# Patient Record
Sex: Male | Born: 1975 | Race: White | Hispanic: No | Marital: Married | Smoking: Never smoker
Health system: Southern US, Community
[De-identification: ages and names within clinical notes are randomized; demographics above are authoritative.]

## PROBLEM LIST (undated history)

## (undated) DIAGNOSIS — I10 Essential (primary) hypertension: Secondary | ICD-10-CM

## (undated) DIAGNOSIS — R519 Headache, unspecified: Secondary | ICD-10-CM

## (undated) DIAGNOSIS — K219 Gastro-esophageal reflux disease without esophagitis: Secondary | ICD-10-CM

## (undated) DIAGNOSIS — R51 Headache: Secondary | ICD-10-CM

## (undated) HISTORY — PX: VASECTOMY: SHX75

## (undated) HISTORY — PX: NO PAST SURGERIES: SHX2092

---

## 1997-11-18 ENCOUNTER — Emergency Department (HOSPITAL_COMMUNITY): Admission: EM | Admit: 1997-11-18 | Discharge: 1997-11-18 | Payer: Self-pay | Admitting: Internal Medicine

## 2004-04-08 ENCOUNTER — Emergency Department: Payer: Self-pay | Admitting: Emergency Medicine

## 2006-10-26 ENCOUNTER — Inpatient Hospital Stay: Payer: Self-pay | Admitting: General Surgery

## 2008-06-20 ENCOUNTER — Emergency Department: Payer: Self-pay | Admitting: Emergency Medicine

## 2009-09-19 ENCOUNTER — Ambulatory Visit: Payer: Self-pay | Admitting: Urology

## 2009-10-16 ENCOUNTER — Ambulatory Visit: Payer: Self-pay | Admitting: Urology

## 2011-10-26 ENCOUNTER — Ambulatory Visit: Payer: Self-pay | Admitting: Internal Medicine

## 2012-11-23 ENCOUNTER — Ambulatory Visit: Payer: Self-pay | Admitting: Family Medicine

## 2015-11-10 ENCOUNTER — Other Ambulatory Visit: Payer: Self-pay | Admitting: Orthopedic Surgery

## 2015-11-10 DIAGNOSIS — M25562 Pain in left knee: Secondary | ICD-10-CM

## 2015-11-11 ENCOUNTER — Ambulatory Visit
Admission: RE | Admit: 2015-11-11 | Discharge: 2015-11-11 | Disposition: A | Discharge status: Home / Self Care | Payer: No Typology Code available for payment source | Source: Ambulatory Visit | Attending: Orthopedic Surgery | Admitting: Orthopedic Surgery

## 2015-11-11 DIAGNOSIS — M659 Synovitis and tenosynovitis, unspecified: Secondary | ICD-10-CM | POA: Diagnosis not present

## 2015-11-11 DIAGNOSIS — M25562 Pain in left knee: Secondary | ICD-10-CM

## 2015-11-11 DIAGNOSIS — M25461 Effusion, right knee: Secondary | ICD-10-CM | POA: Diagnosis not present

## 2015-11-11 DIAGNOSIS — I8392 Asymptomatic varicose veins of left lower extremity: Secondary | ICD-10-CM | POA: Insufficient documentation

## 2015-11-11 DIAGNOSIS — M23222 Derangement of posterior horn of medial meniscus due to old tear or injury, left knee: Secondary | ICD-10-CM | POA: Diagnosis not present

## 2015-11-26 ENCOUNTER — Encounter
Admission: RE | Admit: 2015-11-26 | Discharge: 2015-11-26 | Disposition: A | Discharge status: Home / Self Care | Payer: No Typology Code available for payment source | Source: Ambulatory Visit | Attending: Orthopedic Surgery | Admitting: Orthopedic Surgery

## 2015-11-26 DIAGNOSIS — M6752 Plica syndrome, left knee: Secondary | ICD-10-CM | POA: Diagnosis not present

## 2015-11-26 DIAGNOSIS — K219 Gastro-esophageal reflux disease without esophagitis: Secondary | ICD-10-CM | POA: Diagnosis not present

## 2015-11-26 DIAGNOSIS — M25462 Effusion, left knee: Secondary | ICD-10-CM | POA: Diagnosis not present

## 2015-11-26 DIAGNOSIS — M23222 Derangement of posterior horn of medial meniscus due to old tear or injury, left knee: Secondary | ICD-10-CM | POA: Diagnosis present

## 2015-11-26 DIAGNOSIS — I10 Essential (primary) hypertension: Secondary | ICD-10-CM | POA: Diagnosis not present

## 2015-11-26 HISTORY — DX: Headache: R51

## 2015-11-26 HISTORY — DX: Essential (primary) hypertension: I10

## 2015-11-26 HISTORY — DX: Gastro-esophageal reflux disease without esophagitis: K21.9

## 2015-11-26 HISTORY — DX: Headache, unspecified: R51.9

## 2015-11-26 LAB — BASIC METABOLIC PANEL
ANION GAP: 9 (ref 5–15)
BUN: 18 mg/dL (ref 6–20)
CHLORIDE: 100 mmol/L — AB (ref 101–111)
CO2: 25 mmol/L (ref 22–32)
Calcium: 9.3 mg/dL (ref 8.9–10.3)
Creatinine, Ser: 0.69 mg/dL (ref 0.61–1.24)
GFR calc non Af Amer: >60 mL/min (ref >60)
Glucose, Bld: 228 mg/dL — ABNORMAL HIGH (ref 65–99)
POTASSIUM: 3.6 mmol/L (ref 3.5–5.1)
SODIUM: 134 mmol/L — AB (ref 135–145)

## 2015-11-26 NOTE — Patient Instructions (Signed)
  Your procedure is scheduled on: 11-27-15 [facility](THURSDAY) Report to Same Day Surgery 2nd floor medical mall To find out your arrival time please call [phone] between 1PM - 3PM on 11-26-15 [facility](WEDNESDAY)  Remember: Instructions that are not followed completely may result in serious medical risk, up to and including death, or upon the discretion of your surgeon and anesthesiologist your surgery may need to be rescheduled.    _x___ 1. Do not eat food or drink liquids after midnight. No gum chewing or hard candies.     __x__ 2. No Alcohol for 24 hours before or after surgery.   __x__3. No Smoking for 24 prior to surgery.   ____  4. Bring all medications with you on the day of surgery if instructed.    __x__ 5. Notify your doctor if there is any change in your medical condition     (cold, fever, infections).     Do not wear jewelry, make-up, hairpins, clips or nail polish.  Do not wear lotions, powders, or perfumes. You may wear deodorant.  Do not shave 48 hours prior to surgery. Men may shave face and neck.  Do not bring valuables to the hospital.    [facility] is not responsible for any belongings or valuables.               Contacts, dentures or bridgework may not be worn into surgery.  Leave your suitcase in the car. After surgery it may be brought to your room.  For patients admitted to the hospital, discharge time is determined by your treatment team.   Patients discharged the day of surgery will not be allowed to drive home.    Please read over the following fact sheets that you were given:   [facility] Preparing for Surgery and or MRSA Information   ____ Take these medicines the morning of surgery with A SIP OF WATER:    1. NONE  2.  3.  4.  5.  6.  ____Fleets enema or Magnesium Citrate as directed.   _x___ Use CHG Soap or sage wipes as directed on instruction sheet   ____ Use inhalers on the day of surgery and bring to hospital day of surgery  ____ Stop metformin  2 days prior to surgery    ____ Take 1/2 of usual insulin dose the night before surgery and none on the morning of  surgery.   ____ Stop aspirin or coumadin, or plavix  x__ Stop Anti-inflammatories such as Advil, Aleve, Ibuprofen, Motrin, Naproxen,          Naprosyn, Goodies powders or aspirin products. Ok to CONTINUE HYDROCODONE IF NEEDED   ____ Stop supplements until after surgery.    ____ Bring C-Pap to the hospital.

## 2015-11-27 ENCOUNTER — Encounter: Payer: Self-pay | Admitting: *Deleted

## 2015-11-27 ENCOUNTER — Encounter
Admission: RE | Disposition: A | Discharge status: Home / Self Care | Payer: Self-pay | Source: Ambulatory Visit | Attending: Orthopedic Surgery

## 2015-11-27 ENCOUNTER — Ambulatory Visit: Payer: No Typology Code available for payment source | Admitting: Certified Registered"

## 2015-11-27 ENCOUNTER — Ambulatory Visit
Admission: RE | Admit: 2015-11-27 | Discharge: 2015-11-27 | Disposition: A | Discharge status: Home / Self Care | Payer: No Typology Code available for payment source | Source: Ambulatory Visit | Attending: Orthopedic Surgery | Admitting: Orthopedic Surgery

## 2015-11-27 DIAGNOSIS — M25462 Effusion, left knee: Secondary | ICD-10-CM | POA: Insufficient documentation

## 2015-11-27 DIAGNOSIS — M23222 Derangement of posterior horn of medial meniscus due to old tear or injury, left knee: Secondary | ICD-10-CM | POA: Insufficient documentation

## 2015-11-27 DIAGNOSIS — K219 Gastro-esophageal reflux disease without esophagitis: Secondary | ICD-10-CM | POA: Insufficient documentation

## 2015-11-27 DIAGNOSIS — M6752 Plica syndrome, left knee: Secondary | ICD-10-CM | POA: Insufficient documentation

## 2015-11-27 DIAGNOSIS — I10 Essential (primary) hypertension: Secondary | ICD-10-CM | POA: Insufficient documentation

## 2015-11-27 HISTORY — PX: KNEE ARTHROSCOPY: SHX127

## 2015-11-27 LAB — GLUCOSE, CAPILLARY: GLUCOSE-CAPILLARY: 225 mg/dL — AB (ref 65–99)

## 2015-11-27 SURGERY — ARTHROSCOPY, KNEE
Anesthesia: Choice | Laterality: Left

## 2015-11-27 MED ORDER — LABETALOL HCL 5 MG/ML IV SOLN
INTRAVENOUS | Status: AC
  Administered 2015-11-27: 5 mg via INTRAVENOUS
  Filled 2015-11-27: qty 4

## 2015-11-27 MED ORDER — FAMOTIDINE 20 MG PO TABS
20.0000 mg | ORAL_TABLET | Freq: Once | ORAL | Status: AC
  Administered 2015-11-27: 20 mg via ORAL

## 2015-11-27 MED ORDER — BUPIVACAINE-EPINEPHRINE (PF) 0.5% -1:200000 IJ SOLN
INTRAMUSCULAR | Status: DC | PRN
  Administered 2015-11-27: 30 mL

## 2015-11-27 MED ORDER — DIPHENHYDRAMINE HCL 50 MG/ML IJ SOLN
INTRAMUSCULAR | Status: DC | PRN
  Administered 2015-11-27: 12.5 mg via INTRAVENOUS

## 2015-11-27 MED ORDER — ONDANSETRON HCL 4 MG/2ML IJ SOLN
INTRAMUSCULAR | Status: DC | PRN
  Administered 2015-11-27: 4 mg via INTRAVENOUS

## 2015-11-27 MED ORDER — LABETALOL HCL 5 MG/ML IV SOLN
INTRAVENOUS | Status: DC | PRN
  Administered 2015-11-27: 10 mg via INTRAVENOUS

## 2015-11-27 MED ORDER — LIDOCAINE HCL (CARDIAC) 20 MG/ML IV SOLN
INTRAVENOUS | Status: DC | PRN
  Administered 2015-11-27: 100 mg via INTRAVENOUS

## 2015-11-27 MED ORDER — ACETAMINOPHEN 10 MG/ML IV SOLN
INTRAVENOUS | Status: AC
  Filled 2015-11-27: qty 100

## 2015-11-27 MED ORDER — MIDAZOLAM HCL 2 MG/2ML IJ SOLN
INTRAMUSCULAR | Status: DC | PRN
Start: 2015-11-27 — End: 2015-11-27
  Administered 2015-11-27: 2 mg via INTRAVENOUS

## 2015-11-27 MED ORDER — BUPIVACAINE-EPINEPHRINE (PF) 0.5% -1:200000 IJ SOLN
INTRAMUSCULAR | Status: AC
  Filled 2015-11-27: qty 30

## 2015-11-27 MED ORDER — HYDROCODONE-ACETAMINOPHEN 5-325 MG PO TABS
ORAL_TABLET | ORAL | Status: AC
  Administered 2015-11-27: 1
  Filled 2015-11-27: qty 1

## 2015-11-27 MED ORDER — FENTANYL CITRATE (PF) 100 MCG/2ML IJ SOLN
INTRAMUSCULAR | Status: DC | PRN
  Administered 2015-11-27 (×3): 25 ug via INTRAVENOUS
  Administered 2015-11-27: 50 ug via INTRAVENOUS
  Administered 2015-11-27: 25 ug via INTRAVENOUS
  Administered 2015-11-27 (×2): 50 ug via INTRAVENOUS

## 2015-11-27 MED ORDER — FENTANYL CITRATE (PF) 100 MCG/2ML IJ SOLN
25.0000 ug | INTRAMUSCULAR | Status: AC | PRN
  Administered 2015-11-27 (×6): 25 ug via INTRAVENOUS

## 2015-11-27 MED ORDER — ONDANSETRON HCL 4 MG/2ML IJ SOLN: 4.0000 mg | Freq: Once | INTRAMUSCULAR | Status: DC | PRN

## 2015-11-27 MED ORDER — LABETALOL HCL 5 MG/ML IV SOLN
5.0000 mg | INTRAVENOUS | Status: DC | PRN
  Administered 2015-11-27 (×2): 5 mg via INTRAVENOUS

## 2015-11-27 MED ORDER — PROPOFOL 10 MG/ML IV BOLUS
INTRAVENOUS | Status: DC | PRN
  Administered 2015-11-27: 200 mg via INTRAVENOUS

## 2015-11-27 MED ORDER — LACTATED RINGERS IV SOLN: INTRAVENOUS | Status: DC

## 2015-11-27 MED ORDER — HYDROCODONE-ACETAMINOPHEN 5-325 MG PO TABS: 1.0000 | ORAL_TABLET | Freq: Four times a day (QID) | ORAL | 0 refills | Status: DC | PRN

## 2015-11-27 MED ORDER — HYDROCODONE-ACETAMINOPHEN 5-325 MG PO TABS
ORAL_TABLET | ORAL | Status: AC
  Filled 2015-11-27: qty 1

## 2015-11-27 MED ORDER — FENTANYL CITRATE (PF) 100 MCG/2ML IJ SOLN
INTRAMUSCULAR | Status: AC
  Administered 2015-11-27: 25 ug via INTRAVENOUS
  Filled 2015-11-27: qty 2

## 2015-11-27 MED ORDER — HYDROCODONE-ACETAMINOPHEN 5-325 MG PO TABS
1.0000 | ORAL_TABLET | Freq: Four times a day (QID) | ORAL | Status: AC | PRN
Start: 2015-11-27 — End: 2015-11-27
  Administered 2015-11-27 (×2): 1 via ORAL

## 2015-11-27 MED ORDER — FAMOTIDINE 20 MG PO TABS
ORAL_TABLET | ORAL | Status: AC
  Filled 2015-11-27: qty 1

## 2015-11-27 MED ORDER — SODIUM CHLORIDE 0.9 % IV SOLN
INTRAVENOUS | Status: DC
  Administered 2015-11-27: 10:00:00 via INTRAVENOUS

## 2015-11-27 SURGICAL SUPPLY — 29 items
BANDAGE ACE 4X5 VEL STRL LF (GAUZE/BANDAGES/DRESSINGS) IMPLANT
BANDAGE ELASTIC 4 LF NS (GAUZE/BANDAGES/DRESSINGS) ×3 IMPLANT
BLADE FULL RADIUS 3.5 (BLADE) IMPLANT
BLADE INCISOR PLUS 4.5 (BLADE) IMPLANT
BLADE SHAVER 4.5 DBL SERAT CV (CUTTER) IMPLANT
BLADE SHAVER 4.5X7 STR FR (MISCELLANEOUS) IMPLANT
BNDG CMPR MED 5X4 ELC HKLP NS (GAUZE/BANDAGES/DRESSINGS) ×1
CHLORAPREP W/TINT 26ML (MISCELLANEOUS) ×3 IMPLANT
CUFF TOURN 24 STER (MISCELLANEOUS) IMPLANT
CUFF TOURN 30 STER DUAL PORT (MISCELLANEOUS) ×2 IMPLANT
GAUZE SPONGE 4X4 12PLY STRL (GAUZE/BANDAGES/DRESSINGS) ×3 IMPLANT
GLOVE SURG SYN 9.0  PF PI (GLOVE) ×2
GLOVE SURG SYN 9.0 PF PI (GLOVE) ×1 IMPLANT
GOWN SRG 2XL LVL 4 RGLN SLV (GOWNS) ×1 IMPLANT
GOWN STRL NON-REIN 2XL LVL4 (GOWNS) ×3
GOWN STRL REUS W/ TWL LRG LVL3 (GOWN DISPOSABLE) ×2 IMPLANT
GOWN STRL REUS W/TWL LRG LVL3 (GOWN DISPOSABLE) ×6
IV LACTATED RINGER IRRG 3000ML (IV SOLUTION) ×9
IV LR IRRIG 3000ML ARTHROMATIC (IV SOLUTION) ×2 IMPLANT
KIT RM TURNOVER STRD PROC AR (KITS) ×3 IMPLANT
MANIFOLD NEPTUNE II (INSTRUMENTS) ×3 IMPLANT
PACK ARTHROSCOPY KNEE (MISCELLANEOUS) ×3 IMPLANT
SET TUBE SUCT SHAVER OUTFL 24K (TUBING) ×3 IMPLANT
SET TUBE TIP INTRA-ARTICULAR (MISCELLANEOUS) ×3 IMPLANT
SUT ETHILON 4-0 (SUTURE) ×3
SUT ETHILON 4-0 FS2 18XMFL BLK (SUTURE) ×1
SUTURE ETHLN 4-0 FS2 18XMF BLK (SUTURE) ×1 IMPLANT
TUBING ARTHRO INFLOW-ONLY STRL (TUBING) ×3 IMPLANT
WAND HAND CNTRL MULTIVAC 50 (MISCELLANEOUS) ×3 IMPLANT

## 2015-11-27 NOTE — Progress Notes (Signed)
Pt having quite a bit of discomfort   Dr. Rosita KeaMenz called and pain pill repeated per doctor

## 2015-11-27 NOTE — Transfer of Care (Signed)
Immediate Anesthesia Transfer of Care Note  Patient: Brent LootsBrian E Hershberger  Procedure(s) Performed: Procedure(s): left knee arthroscopy, medial menisectomy, excision of plica (Left)  Patient Location: PACU  Anesthesia Type:General  Level of Consciousness: awake  Airway & Oxygen Therapy: Patient Spontanous Breathing and Patient connected to face mask oxygen  Post-op Assessment: Report given to RN and Post -op Vital signs reviewed and stable  Post vital signs: Reviewed and stable  Last Vitals:  Vitals:   11/27/15 1001  BP: 140/90  Pulse: 75  Resp: 16  Temp: 36.8 C    Last Pain:  Vitals:   11/27/15 1001  TempSrc: Oral  PainSc: 5          Complications: No apparent anesthesia complications

## 2015-11-27 NOTE — Progress Notes (Signed)
STATES PAIN IS IMPROVED AND IS READY TO GO HOME

## 2015-11-27 NOTE — Anesthesia Procedure Notes (Signed)
Procedure Name: LMA Insertion Performed by: Yosgar Demirjian Pre-anesthesia Checklist: Patient identified, Patient being monitored, Timeout performed, Emergency Drugs available and Suction available Patient Re-evaluated:Patient Re-evaluated prior to inductionOxygen Delivery Method: Circle system utilized Preoxygenation: Pre-oxygenation with 100% oxygen Intubation Type: IV induction Ventilation: Mask ventilation without difficulty LMA: LMA inserted LMA Size: 5.0 Tube type: Oral Number of attempts: 1 Placement Confirmation: positive ETCO2 and breath sounds checked- equal and bilateral Tube secured with: Tape Dental Injury: Teeth and Oropharynx as per pre-operative assessment      

## 2015-11-27 NOTE — Op Note (Signed)
11/27/2015  12:26 PM  PATIENT:  Brent Compton  40 y.o. male  PRE-OPERATIVE DIAGNOSIS:  acute medial meniscus tear  POST-OPERATIVE DIAGNOSIS:  medial meniscal tear, plica left knee  PROCEDURE:  Procedure(s): left knee arthroscopy, medial menisectomy, excision of plica (Left)  SURGEON: Leitha SchullerMichael J Perris Conwell, MD  ASSISTANTS: None  ANESTHESIA:   general  EBL:  Total I/O In: 500 [I.V.:500] Out: 20 [Blood:20]  BLOOD ADMINISTERED:none  DRAINS: none   LOCAL MEDICATIONS USED:  MARCAINE     SPECIMEN:  No Specimen  DISPOSITION OF SPECIMEN:  N/A  COUNTS:  YES  TOURNIQUET:    IMPLANTS: None  DICTATION: .Dragon Dictation patient was brought to the operating room and after adequate general anesthesia was obtained, the left leg was placed in the arthroscopic leg holder with a tourniquet applied. After prepping and draping in the usual sterile fashion, appropriate patient identification and timeout procedures were completed. An inferior lateral portal was made and the arthroscope introduced. Initial inspection revealed plica band at them medial femoral condyle which appeared to impinge at the patellofemoral joint. Gutters were free of any loose bodies. Going to the medial compartment inferior medial portal was made and on probing there is extensive tear of the posterior horn of the medial meniscus consistent with MRI. This had predominantly vertical component to it but it was very degenerated a degenerated and had no chance for repair. Meniscal Punch and ArthriCare wand were used to debride this back to a stable margin. There was some fibrillation of the medial femoral condyle and tibial condyle but no fissuring or exposed bone. Anterior cruciate ligament intact lateral compartment normal at this point the shaver was introduced and the plica band was debrided so it no longer impinged at the patellofemoral joint. Pre-and post procedure pictures obtained after addressing the meniscal pathology and  plica the knee was thoroughly irrigated and all argentation withdrawn. Wounds closed with simple 4-0 nylon skin suture and infiltrated with 20 cc half percent Sensorcaine for postop analgesia. Xeroform 4 x 4's web roll and Ace wrap applied  PLAN OF CARE: Discharge to home after PACU  PATIENT DISPOSITION:  PACU - hemodynamically stable.

## 2015-11-27 NOTE — Discharge Instructions (Signed)
Take the medicine as directed. Leave dressing on but if bandage slides down the leg remove entire bandage, covered to incisions with Band-Aids and reapply Ace wrap. Weightbearing as tolerated on leg. Aspirin 325 mg daily until walking more normally

## 2015-11-27 NOTE — H&P (Signed)
Reviewed paper H+P, will be scanned into chart. No changes noted.  

## 2015-11-27 NOTE — Anesthesia Preprocedure Evaluation (Signed)
Anesthesia Evaluation  Patient identified by MRN, date of birth, ID band Patient awake    Reviewed: Allergy & Precautions, H&P , NPO status , Patient's Chart, lab work & pertinent test results, reviewed documented beta blocker date and time   Airway Mallampati: II  TM Distance: >3 FB Neck ROM: full    Dental  (+) Teeth Intact   Pulmonary neg pulmonary ROS,    Pulmonary exam normal        Cardiovascular Exercise Tolerance: Good hypertension, negative cardio ROS Normal cardiovascular exam Rhythm:regular Rate:Normal     Neuro/Psych  Headaches, negative neurological ROS  negative psych ROS   GI/Hepatic negative GI ROS, Neg liver ROS, GERD  Medicated,  Endo/Other  negative endocrine ROS  Renal/GU negative Renal ROS  negative genitourinary   Musculoskeletal   Abdominal   Peds  Hematology negative hematology ROS (+)   Anesthesia Other Findings   Reproductive/Obstetrics negative OB ROS                             Anesthesia Physical Anesthesia Plan  ASA: III  Anesthesia Plan: General LMA   Post-op Pain Management:    Induction:   Airway Management Planned:   Additional Equipment:   Intra-op Plan:   Post-operative Plan:   Informed Consent: I have reviewed the patients History and Physical, chart, labs and discussed the procedure including the risks, benefits and alternatives for the proposed anesthesia with the patient or authorized representative who has indicated his/her understanding and acceptance.     Plan Discussed with: CRNA  Anesthesia Plan Comments:         Anesthesia Quick Evaluation

## 2015-11-28 ENCOUNTER — Encounter: Payer: Self-pay | Admitting: Orthopedic Surgery

## 2015-12-01 NOTE — Anesthesia Postprocedure Evaluation (Signed)
Anesthesia Post Note  Patient: Brent Compton  Procedure(s) Performed: Procedure(s) (LRB): left knee arthroscopy, medial menisectomy, excision of plica (Left)  Patient location during evaluation: PACU Anesthesia Type: General Level of consciousness: awake and alert Pain management: pain level controlled Vital Signs Assessment: post-procedure vital signs reviewed and stable Respiratory status: spontaneous breathing, nonlabored ventilation, respiratory function stable and patient connected to nasal cannula oxygen Cardiovascular status: blood pressure returned to baseline and stable Postop Assessment: no signs of nausea or vomiting Anesthetic complications: no    Last Vitals:  Vitals:   11/27/15 1400 11/27/15 1502  BP: (!) 150/80 (!) 165/82  Pulse: 67 72  Resp:    Temp:      Last Pain:  Vitals:   11/28/15 0804  TempSrc:   PainSc: 3                  Yevette EdwardsJames G Senetra Dillin

## 2017-11-29 ENCOUNTER — Ambulatory Visit (INDEPENDENT_AMBULATORY_CARE_PROVIDER_SITE_OTHER): Payer: 59 | Admitting: Physician Assistant

## 2017-11-29 ENCOUNTER — Encounter: Payer: Self-pay | Admitting: Physician Assistant

## 2017-11-29 VITALS — BP 130/84 | HR 76 | Temp 98.5°F | Resp 16 | Ht 71.0 in | Wt 294.8 lb

## 2017-11-29 DIAGNOSIS — E1169 Type 2 diabetes mellitus with other specified complication: Secondary | ICD-10-CM | POA: Diagnosis not present

## 2017-11-29 DIAGNOSIS — Z114 Encounter for screening for human immunodeficiency virus [HIV]: Secondary | ICD-10-CM

## 2017-11-29 DIAGNOSIS — I10 Essential (primary) hypertension: Secondary | ICD-10-CM

## 2017-11-29 DIAGNOSIS — R739 Hyperglycemia, unspecified: Secondary | ICD-10-CM

## 2017-11-29 DIAGNOSIS — E785 Hyperlipidemia, unspecified: Secondary | ICD-10-CM

## 2017-11-29 LAB — POCT GLYCOSYLATED HEMOGLOBIN (HGB A1C)
Est. average glucose Bld gHb Est-mCnc: 212
Hemoglobin A1C: 9 % — AB (ref 4.0–5.6)

## 2017-11-29 NOTE — Patient Instructions (Signed)

## 2017-11-29 NOTE — Progress Notes (Signed)
synthroi      Patient: Brent Compton, Male    DOB: 12/04/1975, 42 y.o.   MRN: [id number] Visit Date: 11/29/2017  Today's Provider: Trey Sailors, PA-C   Chief Complaint  Patient presents with  . New Patient (Initial Visit)   Subjective:    Annual physical exam Brent Compton is a 42 y.o. male who presents today for health maintenance and complete physical. He feels well. He reports exercising none. He reports he is sleeping well.  Living in [place], [place]. Works for [employer]. Lives with wife of 15 years. Three girls - ages 51, 73, and 28. No health issues. Reports having had a tetanus shot this year.   High blood pressure - takes Lisinopril HCTZ 20-12.5 mg daily. Has been taking it 3 years. Reports he is stable on this medication, denies chest pain, SOB, edema.   BP Readings from Last 3 Encounters:  11/29/17 130/84  11/27/15 (!) 165/82   Wt Readings from Last 3 Encounters:  11/29/17 294 lb 12.8 oz (133.7 kg)  11/27/15 285 lb (129.3 kg)  11/26/15 300 lb (136.1 kg)    CMET from 2017 during surgery for meniscal repair showed sugar of 225. He reports not having sugars checked since then. Reports family history of diabetes in mother. Reports frequent urination and thirst. A1c today is 9.0%. -----------------------------------------------------------------   Review of Systems  Constitutional: Negative.   HENT: Negative.   Eyes: Negative.   Respiratory: Negative.   Cardiovascular: Negative.   Gastrointestinal: Negative.   Endocrine: Negative.   Genitourinary: Negative.   Musculoskeletal: Negative.   Skin: Negative.   Allergic/Immunologic: Negative.   Neurological: Negative.   Hematological: Negative.   Psychiatric/Behavioral: Negative.     Social History      He  reports that he has never smoked. He has never used smokeless tobacco. He reports that he drinks alcohol. He reports that he does not use drugs.       Social History   Socioeconomic History  . Marital  status: Married    Spouse name: Not on file  . Number of children: Not on file  . Years of education: Not on file  . Highest education level: Not on file  Occupational History  . Not on file  Social Needs  . Financial resource strain: Not on file  . Food insecurity:    Worry: Not on file    Inability: Not on file  . Transportation needs:    Medical: Not on file    Non-medical: Not on file  Tobacco Use  . Smoking status: Never Smoker  . Smokeless tobacco: Never Used  Substance and Sexual Activity  . Alcohol use: Yes    Comment: OCC  . Drug use: No  . Sexual activity: Not on file  Lifestyle  . Physical activity:    Days per week: Not on file    Minutes per session: Not on file  . Stress: Not on file  Relationships  . Social connections:    Talks on phone: Not on file    Gets together: Not on file    Attends religious service: Not on file    Active member of club or organization: Not on file    Attends meetings of clubs or organizations: Not on file    Relationship status: Not on file  Other Topics Concern  . Not on file  Social History Narrative  . Not on file    Past Medical History:  Diagnosis Date  .  GERD (gastroesophageal reflux disease)    RARE-ROLAIDS PRN  . Headache    RARE MIGRAINES  . Hypertension      There are no active problems to display for this patient.   Past Surgical History:  Procedure Laterality Date  . KNEE ARTHROSCOPY Left 11/27/2015   Procedure: left knee arthroscopy, medial menisectomy, excision of plica;  Surgeon: Kennedy Bucker, MD;  Location: [facility] ORS;  Service: Orthopedics;  Laterality: Left;  . NO PAST SURGERIES    . VASECTOMY      Family History        Family Status  Relation Name Status  . Mother  Alive  . Father  (Not Specified)        His family history includes Diabetes in his mother; Hypertension in his father and mother.      No Known Allergies   Current Outpatient Medications:  .   lisinopril-hydrochlorothiazide (PRINZIDE,ZESTORETIC) 20-12.5 MG tablet, Take 1 tablet by mouth every morning., Disp: , Rfl:    Patient Care Team: Maryella Shivers as PCP - General (Physician Assistant)      Objective:   Vitals: BP 130/84 (BP Location: Left Arm, Patient Position: Sitting, Cuff Size: Large)   Pulse 76   Temp 98.5 F (36.9 C) (Oral)   Resp 16   Ht 5\' 11"  (1.803 m)   Wt 294 lb 12.8 oz (133.7 kg)   BMI 41.12 kg/m    Vitals:   11/29/17 1408  BP: 130/84  Pulse: 76  Resp: 16  Temp: 98.5 F (36.9 C)  TempSrc: Oral  Weight: 294 lb 12.8 oz (133.7 kg)  Height: 5\' 11"  (1.803 m)     Physical Exam  Constitutional: He is oriented to person, place, and time. He appears well-developed and well-nourished.  Cardiovascular: Normal rate and regular rhythm.  Pulmonary/Chest: Effort normal and breath sounds normal.  Abdominal: Soft. Bowel sounds are normal.  Neurological: He is alert and oriented to person, place, and time.  Skin: Skin is warm and dry.  Psychiatric: He has a normal mood and affect. His behavior is normal.     Depression Screen PHQ 2/9 Scores 11/29/2017  PHQ - 2 Score 0  PHQ- 9 Score 0      Assessment & Plan:     Routine Health Maintenance and Physical Exam  Exercise Activities and Dietary recommendations Goals   None      There is no immunization history on file for this patient.  Health Maintenance  Topic Date Due  . HIV Screening  09/10/1990  . TETANUS/TDAP  09/10/1994  . INFLUENZA VACCINE  09/01/2017     Discussed health benefits of physical activity, and encouraged him to engage in regular exercise appropriate for his age and condition.    1. Hypertension, unspecified type  Check lab work, refill BP medication   - Comprehensive Metabolic Panel (CMET) - CBC with Differential - TSH - Lipid Profile  2. Hyperglycemia  - POCT HgB A1C  3. Encounter for screening for HIV  - HIV antibody (with reflex)  4. Type 2  diabetes mellitus with other specified complication, without long-term current use of insulin (HCC)  Has diabetes, follow up in 1 week to discuss treatment.   Return in about 1 week (around 12/06/2017) for TYpe II diabetes, 40 min.  The entirety of the information documented in the History of Present Illness, Review of Systems and Physical Exam were personally obtained by me. Portions of this information were initially documented by Rondel Baton,  CMA and reviewed by me for thoroughness and accuracy.   I have spent 45 minutes with this patient, >50% of which was spent on counseling and coordination of care.   --------------------------------------------------------------------    Trey Sailors, PA-C  [facility]

## 2017-11-30 ENCOUNTER — Telehealth: Payer: Self-pay

## 2017-11-30 LAB — COMPREHENSIVE METABOLIC PANEL
ALT: 51 IU/L — ABNORMAL HIGH (ref 0–44)
AST: 25 IU/L (ref 0–40)
Albumin/Globulin Ratio: 1.8 (ref 1.2–2.2)
Albumin: 4.2 g/dL (ref 3.5–5.5)
Alkaline Phosphatase: 66 IU/L (ref 39–117)
BUN/Creatinine Ratio: 22 — ABNORMAL HIGH (ref 9–20)
BUN: 16 mg/dL (ref 6–24)
Bilirubin Total: 0.3 mg/dL (ref 0.0–1.2)
CO2: 19 mmol/L — ABNORMAL LOW (ref 20–29)
Calcium: 9 mg/dL (ref 8.7–10.2)
Chloride: 98 mmol/L (ref 96–106)
Creatinine, Ser: 0.72 mg/dL — ABNORMAL LOW (ref 0.76–1.27)
GFR calc Af Amer: 133 mL/min/{1.73_m2} (ref >59)
GFR calc non Af Amer: 115 mL/min/{1.73_m2} (ref >59)
Globulin, Total: 2.4 g/dL (ref 1.5–4.5)
Glucose: 317 mg/dL — ABNORMAL HIGH (ref 65–99)
Potassium: 4 mmol/L (ref 3.5–5.2)
Sodium: 136 mmol/L (ref 134–144)
Total Protein: 6.6 g/dL (ref 6.0–8.5)

## 2017-11-30 LAB — CBC WITH DIFFERENTIAL/PLATELET
Basophils Absolute: 0.1 10*3/uL (ref 0.0–0.2)
Basos: 1 %
EOS (ABSOLUTE): 0.3 10*3/uL (ref 0.0–0.4)
Eos: 4 %
Hematocrit: 46.2 % (ref 37.5–51.0)
Hemoglobin: 16.2 g/dL (ref 13.0–17.7)
Immature Grans (Abs): 0 10*3/uL (ref 0.0–0.1)
Immature Granulocytes: 1 %
Lymphocytes Absolute: 2.4 10*3/uL (ref 0.7–3.1)
Lymphs: 35 %
MCH: 30.7 pg (ref 26.6–33.0)
MCHC: 35.1 g/dL (ref 31.5–35.7)
MCV: 88 fL (ref 79–97)
Monocytes Absolute: 0.5 10*3/uL (ref 0.1–0.9)
Monocytes: 7 %
Neutrophils Absolute: 3.7 10*3/uL (ref 1.4–7.0)
Neutrophils: 52 %
Platelets: 208 10*3/uL (ref 150–450)
RBC: 5.28 x10E6/uL (ref 4.14–5.80)
RDW: 12.7 % (ref 12.3–15.4)
WBC: 7 10*3/uL (ref 3.4–10.8)

## 2017-11-30 LAB — TSH: TSH: 1.41 u[IU]/mL (ref 0.450–4.500)

## 2017-11-30 LAB — LIPID PANEL
Chol/HDL Ratio: 5.6 ratio — ABNORMAL HIGH (ref 0.0–5.0)
Cholesterol, Total: 179 mg/dL (ref 100–199)
HDL: 32 mg/dL — ABNORMAL LOW (ref >39)
Triglycerides: 775 mg/dL (ref 0–149)

## 2017-11-30 LAB — HIV ANTIBODY (ROUTINE TESTING W REFLEX): HIV Screen 4th Generation wRfx: NONREACTIVE

## 2017-11-30 NOTE — Telephone Encounter (Signed)
-----   Message from Trey Sailors, [place] sent at 11/30/2017  9:16 AM EDT ----- Sugar elevated, which is expected for diabetes. CBC normal. TSH normal. Cholesterol with very high triglycerides, can we add direct LDL under dx hyperlipidemia? HIV negative. We will discuss further treatment in follow up.

## 2017-11-30 NOTE — Telephone Encounter (Signed)
Called [facility]

## 2017-12-02 DIAGNOSIS — E119 Type 2 diabetes mellitus without complications: Secondary | ICD-10-CM | POA: Insufficient documentation

## 2017-12-06 ENCOUNTER — Ambulatory Visit (INDEPENDENT_AMBULATORY_CARE_PROVIDER_SITE_OTHER): Payer: 59 | Admitting: Physician Assistant

## 2017-12-06 ENCOUNTER — Encounter: Payer: Self-pay | Admitting: Physician Assistant

## 2017-12-06 VITALS — BP 116/80 | HR 97 | Temp 98.2°F | Resp 16 | Wt 288.0 lb

## 2017-12-06 DIAGNOSIS — Z23 Encounter for immunization: Secondary | ICD-10-CM | POA: Diagnosis not present

## 2017-12-06 DIAGNOSIS — E1169 Type 2 diabetes mellitus with other specified complication: Secondary | ICD-10-CM | POA: Diagnosis not present

## 2017-12-06 DIAGNOSIS — I10 Essential (primary) hypertension: Secondary | ICD-10-CM | POA: Diagnosis not present

## 2017-12-06 MED ORDER — LISINOPRIL-HYDROCHLOROTHIAZIDE 20-12.5 MG PO TABS: 1.0000 | ORAL_TABLET | ORAL | 0 refills | Status: DC

## 2017-12-06 MED ORDER — METFORMIN HCL 500 MG PO TABS: ORAL_TABLET | ORAL | 3 refills | Status: DC

## 2017-12-06 NOTE — Progress Notes (Signed)
Patient: Brent Compton Male    DOB: 05/03/1975   42 y.o.   MRN: [id number] Visit Date: 12/09/2017  Today's Provider: Trey Sailors, PA-C   Chief Complaint  Patient presents with  . Follow-up   Subjective:    HPI   Patient here to follow up on labs. Patient presented as a new patient to this clinic on 11/29/2017 and his a1c was 9.0%. Glucose on CMET from 2017 was 228, though patient reports he was never formally told he had diabetes. Currently takes Lisinopril HCTZ 20-12.5 mg. Presents with girlfriend today.   Due for eye exam, foot exam, Pneumovax.   Does eat fast food sometimes. Can drink 2-10 beers, it just depends on the day.      No Known Allergies   Current Outpatient Medications:  .  lisinopril-hydrochlorothiazide (PRINZIDE,ZESTORETIC) 20-12.5 MG tablet, Take 1 tablet by mouth every morning., Disp: 90 tablet, Rfl: 0 .  metFORMIN (GLUCOPHAGE) 500 MG tablet, 500 mg 1x daily x 1 wk. Then 500 mg 2x daily x 1 wk. Then 1000 mg in morning & 500 mg nightly x 1 wk. Then 1000 mg 2x daily onward., Disp: 180 tablet, Rfl: 3  Review of Systems  Social History   Tobacco Use  . Smoking status: Never Smoker  . Smokeless tobacco: Never Used  Substance Use Topics  . Alcohol use: Yes    Comment: OCC   Objective:   BP 116/80 (BP Location: Left Arm, Patient Position: Sitting, Cuff Size: Large)   Pulse 97   Temp 98.2 F (36.8 C) (Oral)   Resp 16   Wt 288 lb (130.6 kg)   SpO2 96%   BMI 40.17 kg/m  Vitals:   12/06/17 1607  BP: 116/80  Pulse: 97  Resp: 16  Temp: 98.2 F (36.8 C)  TempSrc: Oral  SpO2: 96%  Weight: 288 lb (130.6 kg)     Physical Exam  Constitutional: He is oriented to person, place, and time. He appears well-developed and well-nourished.  Cardiovascular: Normal rate and regular rhythm.  Pulmonary/Chest: Effort normal and breath sounds normal.  Neurological: He is alert and oriented to person, place, and time.  Skin: Skin is warm and dry.    Psychiatric: He has a normal mood and affect. His behavior is normal.   Diabetic Foot Exam - Simple   Simple Foot Form Diabetic Foot exam was performed with the following findings:  Yes 12/06/2017  4:51 PM  Visual Inspection No deformities, no ulcerations, no other skin breakdown bilaterally:  Yes Sensation Testing Intact to touch and monofilament testing bilaterally:  Yes Pulse Check Posterior Tibialis and Dorsalis pulse intact bilaterally:  Yes Comments         Assessment & Plan:     1. Type 2 diabetes mellitus with other specified complication, without long-term current use of insulin (HCC)  See in one month.   - metFORMIN (GLUCOPHAGE) 500 MG tablet; 500 mg 1x daily x 1 wk. Then 500 mg 2x daily x 1 wk. Then 1000 mg in morning & 500 mg nightly x 1 wk. Then 1000 mg 2x daily onward.  Dispense: 180 tablet; Refill: 3 - Pneumococcal polysaccharide vaccine 23-valent greater than or equal to 2yo subcutaneous/IM  2. Hypertension, unspecified type  - lisinopril-hydrochlorothiazide (PRINZIDE,ZESTORETIC) 20-12.5 MG tablet; Take 1 tablet by mouth every morning.  Dispense: 90 tablet; Refill: 0  The entirety of the information documented in the History of Present Illness, Review of Systems and Physical Exam  were personally obtained by me. Portions of this information were initially documented by Rondel Baton, CMA and reviewed by me for thoroughness and accuracy.        Trey Sailors, PA-C  [facility]

## 2017-12-07 LAB — LDL CHOLESTEROL, DIRECT: LDL Direct: 76 mg/dL (ref 0–99)

## 2017-12-07 LAB — SPECIMEN STATUS REPORT

## 2017-12-09 NOTE — Patient Instructions (Signed)

## 2018-01-05 ENCOUNTER — Encounter: Payer: Self-pay | Admitting: Physician Assistant

## 2018-01-05 ENCOUNTER — Ambulatory Visit (INDEPENDENT_AMBULATORY_CARE_PROVIDER_SITE_OTHER): Payer: 59 | Admitting: Physician Assistant

## 2018-01-05 VITALS — BP 129/82 | HR 102 | Temp 98.5°F | Resp 16 | Wt 280.6 lb

## 2018-01-05 DIAGNOSIS — E1169 Type 2 diabetes mellitus with other specified complication: Secondary | ICD-10-CM | POA: Diagnosis not present

## 2018-01-05 DIAGNOSIS — I10 Essential (primary) hypertension: Secondary | ICD-10-CM | POA: Diagnosis not present

## 2018-01-05 DIAGNOSIS — E1159 Type 2 diabetes mellitus with other circulatory complications: Secondary | ICD-10-CM

## 2018-01-05 LAB — POCT GLYCOSYLATED HEMOGLOBIN (HGB A1C)
Est. average glucose Bld gHb Est-mCnc: 192
Hemoglobin A1C: 8.3 % — AB (ref 4.0–5.6)

## 2018-01-05 MED ORDER — METFORMIN HCL 1000 MG PO TABS: 1000.0000 mg | ORAL_TABLET | Freq: Two times a day (BID) | ORAL | 0 refills | Status: DC

## 2018-01-05 NOTE — Progress Notes (Signed)
,       Patient: Brent Compton Male    DOB: August 03, 1975   42 y.o.   MRN: [id number] Visit Date: 01/06/2018  Today's Provider: Trey Sailors, PA-C   Chief Complaint  Patient presents with  . Follow-up    HTN and T2DM   Subjective:    HPI  Diabetes Mellitus Type II, Follow-up:   Lab Results  Component Value Date   HGBA1C 8.3 (A) 01/05/2018   HGBA1C 9.0 (A) 11/29/2017    Last seen for diabetes 1 months ago.  Management since then includes Metformin titrated up to 1000 mg BID over the course of four weeks. He did not start the metformin immediately after his visit.  He reports excellent compliance with treatment. He is not having side effects.  Current symptoms include none and have been stable. Home blood sugar records: fasting range: 200-150  Episodes of hypoglycemia? no   Current Insulin Regimen: none Most Recent Eye Exam: Is due. He is contacting [facility] to schedule this. Weight trend: decreasing steadily Prior visit with dietician: no Current diet: well balanced. He has stopped drinking beer as regularly.  Current exercise: physical work  Pertinent Labs:    Component Value Date/Time   CHOL 179 11/29/2017 1456   TRIG 775 (HH) 11/29/2017 1456   HDL 32 (L) 11/29/2017 1456   LDLCALC Comment 11/29/2017 1456   CREATININE 0.72 (L) 11/29/2017 1456    Wt Readings from Last 3 Encounters:  01/05/18 280 lb 9.6 oz (127.3 kg)  12/06/17 288 lb (130.6 kg)  11/29/17 294 lb 12.8 oz (133.7 kg)    ------------------------------------------------------------------------  Hypertension, follow-up:  BP Readings from Last 3 Encounters:  01/05/18 129/82  12/06/17 116/80  11/29/17 130/84    He was last seen for hypertension 1 months ago.  BP at that visit was 130/84. Management changes since that visit include none. He currently takes lisinopril-HCTZ 20-12.5mg  daily.  He reports excellent compliance with treatment. He is not having side effects.  He is not  exercising. He in between adherent to low salt diet.   Outside blood pressures are n/a. He is experiencing none.  Patient denies chest pain, chest pressure/discomfort, exertional chest pressure/discomfort, fatigue, irregular heart beat, lower extremity edema and palpitations.   Cardiovascular risk factors include diabetes mellitus and hypertension, male gender.    Weight trend: stablestable Wt Readings from Last 3 Encounters:  01/05/18 280 lb 9.6 oz (127.3 kg)  12/06/17 288 lb (130.6 kg)  11/29/17 294 lb 12.8 oz (133.7 kg)    Current diet: well balanced  ------------------------------------------------------------------------     No Known Allergies   Current Outpatient Medications:  .  lisinopril-hydrochlorothiazide (PRINZIDE,ZESTORETIC) 20-12.5 MG tablet, Take 1 tablet by mouth every morning., Disp: 90 tablet, Rfl: 0 .  metFORMIN (GLUCOPHAGE) 1000 MG tablet, Take 1 tablet (1,000 mg total) by mouth 2 (two) times daily with a meal., Disp: 180 tablet, Rfl: 0  Review of Systems  Social History   Tobacco Use  . Smoking status: Never Smoker  . Smokeless tobacco: Never Used  Substance Use Topics  . Alcohol use: Yes    Comment: OCC   Objective:   BP 129/82 (BP Location: Right Arm, Patient Position: Sitting, Cuff Size: Large)   Pulse (!) 102   Temp 98.5 F (36.9 C) (Oral)   Resp 16   Wt 280 lb 9.6 oz (127.3 kg)   BMI 39.14 kg/m  Vitals:   01/05/18 1624  BP: 129/82  Pulse: (!) 102  Resp: 16  Temp: 98.5 F (36.9 C)  TempSrc: Oral  Weight: 280 lb 9.6 oz (127.3 kg)     Physical Exam  Constitutional: He is oriented to person, place, and time. He appears well-developed and well-nourished.  Cardiovascular: Normal rate and regular rhythm.  Pulmonary/Chest: Effort normal and breath sounds normal.  Neurological: He is alert and oriented to person, place, and time.  Skin: Skin is warm and dry.  Psychiatric: He has a normal mood and affect. His behavior is normal.         Assessment & Plan:     1. Type 2 diabetes mellitus with other specified complication, without long-term current use of insulin (HCC)  Improving, A1c down from 9/0% to 8.3%. Will continue current regimen and follow up in two months for DMII . May adjust diabetes medication at this point if A1c > 7.0%. Patient has lost 14 lbs since first visit here and congratulated him on this. Patient will schedule eye exam with [facility].   - POCT glycosylated hemoglobin (Hb A1C) - metFORMIN (GLUCOPHAGE) 1000 MG tablet; Take 1 tablet (1,000 mg total) by mouth 2 (two) times daily with a meal.  Dispense: 180 tablet; Refill: 0  2. Hypertension associated with diabetes (HCC)  Well controlled today, continue BP medication.   The entirety of the information documented in the History of Present Illness, Review of Systems and Physical Exam were personally obtained by me. Portions of this information were initially documented by Hetty ElyJoseline Rosas, CMA and reviewed by me for thoroughness and accuracy.           Trey SailorsAdriana M Ninnie Fein, PA-C  [facility]

## 2018-01-06 DIAGNOSIS — I152 Hypertension secondary to endocrine disorders: Secondary | ICD-10-CM | POA: Insufficient documentation

## 2018-01-06 DIAGNOSIS — I10 Essential (primary) hypertension: Secondary | ICD-10-CM

## 2018-01-06 DIAGNOSIS — E1159 Type 2 diabetes mellitus with other circulatory complications: Secondary | ICD-10-CM | POA: Insufficient documentation

## 2018-01-06 NOTE — Patient Instructions (Signed)

## 2018-01-08 ENCOUNTER — Other Ambulatory Visit: Payer: Self-pay | Admitting: Physician Assistant

## 2018-03-05 ENCOUNTER — Other Ambulatory Visit: Payer: Self-pay | Admitting: Physician Assistant

## 2018-03-05 DIAGNOSIS — I10 Essential (primary) hypertension: Secondary | ICD-10-CM

## 2018-03-14 ENCOUNTER — Ambulatory Visit (INDEPENDENT_AMBULATORY_CARE_PROVIDER_SITE_OTHER): Payer: 59 | Admitting: Physician Assistant

## 2018-03-14 ENCOUNTER — Encounter: Payer: Self-pay | Admitting: Physician Assistant

## 2018-03-14 VITALS — BP 113/76 | HR 73 | Temp 98.6°F | Resp 16 | Wt 270.0 lb

## 2018-03-14 DIAGNOSIS — E1169 Type 2 diabetes mellitus with other specified complication: Secondary | ICD-10-CM

## 2018-03-14 DIAGNOSIS — Z6837 Body mass index (BMI) 37.0-37.9, adult: Secondary | ICD-10-CM

## 2018-03-14 DIAGNOSIS — I10 Essential (primary) hypertension: Secondary | ICD-10-CM

## 2018-03-14 DIAGNOSIS — E1159 Type 2 diabetes mellitus with other circulatory complications: Secondary | ICD-10-CM | POA: Diagnosis not present

## 2018-03-14 DIAGNOSIS — E785 Hyperlipidemia, unspecified: Secondary | ICD-10-CM

## 2018-03-14 LAB — POCT GLYCOSYLATED HEMOGLOBIN (HGB A1C)
Est. average glucose Bld gHb Est-mCnc: 151
Hemoglobin A1C: 6.9 % — AB (ref 4.0–5.6)

## 2018-03-14 NOTE — Progress Notes (Signed)
Patient: Brent LootsBrian E Witty Male    DOB: 10/27/1975   43 y.o.   MRN: [id number] Visit Date: 03/16/2018  Today's Provider: Trey SailorsAdriana M Dmiyah Liscano, PA-C   Chief Complaint  Patient presents with  . Diabetes   Subjective:     HPI  Follow up for diabetes  The patient was last seen for this 2 months ago. Changes made at last visit include checking A1c 8.3. A1c today is 6.9. Currently taking metformin 1000 mg twice daily.  He needs to get an eye exam.   He reports excellent compliance with treatment. He feels that condition is Improved. He is not having side effects.   Lab Results  Component Value Date   HGBA1C 6.9 (A) 03/14/2018     HTN: Currently taking lisinopril-hctz 20-12.5 mg daily. Doing well on this.   BP Readings from Last 3 Encounters:  03/14/18 113/76  01/05/18 129/82  12/06/17 116/80   HLD: Not currently on statin.   Lipid Panel     Component Value Date/Time   CHOL 179 11/29/2017 1456   TRIG 775 (HH) 11/29/2017 1456   HDL 32 (L) 11/29/2017 1456   CHOLHDL 5.6 (H) 11/29/2017 1456   LDLCALC Comment 11/29/2017 1456   LDLDIRECT 76 11/29/2017 1456   Obesity: Has lost 18 pounds since November through diet changes and exercise.  Wt Readings from Last 3 Encounters:  03/14/18 270 lb (122.5 kg)  01/05/18 280 lb 9.6 oz (127.3 kg)  12/06/17 288 lb (130.6 kg)    ------------------------------------------------------------------------------------   No Known Allergies   Current Outpatient Medications:  .  ACCU-CHEK FASTCLIX LANCETS MISC, USE TO CHECK BLOOD SUGAR EVERY MORNING, Disp: 102 each, Rfl: 0 .  lisinopril-hydrochlorothiazide (PRINZIDE,ZESTORETIC) 20-12.5 MG tablet, TAKE 1 TABLET BY MOUTH EVERY DAY IN THE MORNING, Disp: 30 tablet, Rfl: 2 .  metFORMIN (GLUCOPHAGE) 1000 MG tablet, Take 1 tablet (1,000 mg total) by mouth 2 (two) times daily with a meal., Disp: 180 tablet, Rfl: 0  Review of Systems  Social History   Tobacco Use  . Smoking status: Never  Smoker  . Smokeless tobacco: Never Used  Substance Use Topics  . Alcohol use: Yes    Comment: OCC      Objective:   BP 113/76 (BP Location: Left Arm, Patient Position: Sitting, Cuff Size: Normal)   Pulse 73   Temp 98.6 F (37 C) (Oral)   Resp 16   Wt 270 lb (122.5 kg)   BMI 37.66 kg/m  Vitals:   03/14/18 1609  BP: 113/76  Pulse: 73  Resp: 16  Temp: 98.6 F (37 C)  TempSrc: Oral  Weight: 270 lb (122.5 kg)     Physical Exam Constitutional:      Appearance: Normal appearance.  Cardiovascular:     Rate and Rhythm: Normal rate and regular rhythm.     Heart sounds: Normal heart sounds.  Pulmonary:     Effort: Pulmonary effort is normal.     Breath sounds: Normal breath sounds.  Neurological:     Mental Status: He is alert and oriented to person, place, and time. Mental status is at baseline.  Psychiatric:        Mood and Affect: Mood normal.        Behavior: Behavior normal.         Assessment & Plan    1. Type 2 diabetes mellitus with other specified complication, without long-term current use of insulin (HCC)  Controlled at 6.9%. Congratulated patient on  weight loss and commitment to lifestyle changes. He does still need to schedule eye exam for yearly diabetic eye exam.  - POCT glycosylated hemoglobin (Hb A1C)  2. Hyperlipidemia associated with type 2 diabetes mellitus (HCC)  Anticipate starting statin at next visit after repeat lipid panel and once sugars have stabilized.  3. Hypertension associated with diabetes (HCC)  Continue lisinopril-HCTZ 20-125.mg daily.   4. Class 2 severe obesity due to excess calories with serious comorbidity and body mass index (BMI) of 37.0 to 37.9 in adult [facility])  Improving.   The entirety of the information documented in the History of Present Illness, Review of Systems and Physical Exam were personally obtained by me. Portions of this information were initially documented by Rondel Baton, CMA and reviewed by me for  thoroughness and accuracy.   Return in about 3 months (around 06/12/2018) for DM HTN .     Trey Sailors, PA-C  [facility]

## 2018-03-16 DIAGNOSIS — E1169 Type 2 diabetes mellitus with other specified complication: Secondary | ICD-10-CM | POA: Insufficient documentation

## 2018-03-16 DIAGNOSIS — E785 Hyperlipidemia, unspecified: Secondary | ICD-10-CM

## 2018-03-16 NOTE — Patient Instructions (Signed)
Diabetes Mellitus and Exercise Exercising regularly is important for your overall health, especially when you have diabetes (diabetes mellitus). Exercising is not only about losing weight. It has many other health benefits, such as increasing muscle strength and bone density and reducing body fat and stress. This leads to improved fitness, flexibility, and endurance, all of which result in better overall health. Exercise has additional benefits for people with diabetes, including:  Reducing appetite.  Helping to lower and control blood glucose.  Lowering blood pressure.  Helping to control amounts of fatty substances (lipids) in the blood, such as cholesterol and triglycerides.  Helping the body to respond better to insulin (improving insulin sensitivity).  Reducing how much insulin the body needs.  Decreasing the risk for heart disease by: ? Lowering cholesterol and triglyceride levels. ? Increasing the levels of good cholesterol. ? Lowering blood glucose levels. What is my activity plan? Your health care provider or certified diabetes educator can help you make a plan for the type and frequency of exercise (activity plan) that works for you. Make sure that you:  Do at least 150 minutes of moderate-intensity or vigorous-intensity exercise each week. This could be brisk walking, biking, or water aerobics. ? Do stretching and strength exercises, such as yoga or weightlifting, at least 2 times a week. ? Spread out your activity over at least 3 days of the week.  Get some form of physical activity every day. ? Do not go more than 2 days in a row without some kind of physical activity. ? Avoid being inactive for more than 30 minutes at a time. Take frequent breaks to walk or stretch.  Choose a type of exercise or activity that you enjoy, and set realistic goals.  Start slowly, and gradually increase the intensity of your exercise over time. What do I need to know about managing my  diabetes?   Check your blood glucose before and after exercising. ? If your blood glucose is 240 mg/dL (13.3 mmol/L) or higher before you exercise, check your urine for ketones. If you have ketones in your urine, do not exercise until your blood glucose returns to normal. ? If your blood glucose is 100 mg/dL (5.6 mmol/L) or lower, eat a snack containing 15-20 grams of carbohydrate. Check your blood glucose 15 minutes after the snack to make sure that your level is above 100 mg/dL (5.6 mmol/L) before you start your exercise.  Know the symptoms of low blood glucose (hypoglycemia) and how to treat it. Your risk for hypoglycemia increases during and after exercise. Common symptoms of hypoglycemia can include: ? Hunger. ? Anxiety. ? Sweating and feeling clammy. ? Confusion. ? Dizziness or feeling light-headed. ? Increased heart rate or palpitations. ? Blurry vision. ? Tingling or numbness around the mouth, lips, or tongue. ? Tremors or shakes. ? Irritability.  Keep a rapid-acting carbohydrate snack available before, during, and after exercise to help prevent or treat hypoglycemia.  Avoid injecting insulin into areas of the body that are going to be exercised. For example, avoid injecting insulin into: ? The arms, when playing tennis. ? The legs, when jogging.  Keep records of your exercise habits. Doing this can help you and your health care provider adjust your diabetes management plan as needed. Write down: ? Food that you eat before and after you exercise. ? Blood glucose levels before and after you exercise. ? The type and amount of exercise you have done. ? When your insulin is expected to peak, if you use   insulin. Avoid exercising at times when your insulin is peaking.  When you start a new exercise or activity, work with your health care provider to make sure the activity is safe for you, and to adjust your insulin, medicines, or food intake as needed.  Drink plenty of water while  you exercise to prevent dehydration or heat stroke. Drink enough fluid to keep your urine clear or pale yellow. Summary  Exercising regularly is important for your overall health, especially when you have diabetes (diabetes mellitus).  Exercising has many health benefits, such as increasing muscle strength and bone density and reducing body fat and stress.  Your health care provider or certified diabetes educator can help you make a plan for the type and frequency of exercise (activity plan) that works for you.  When you start a new exercise or activity, work with your health care provider to make sure the activity is safe for you, and to adjust your insulin, medicines, or food intake as needed. This information is not intended to replace advice given to you by your health care provider. Make sure you discuss any questions you have with your health care provider. Document Released: 04/10/2003 Document Revised: 07/29/2016 Document Reviewed: 06/30/2015 Elsevier Interactive Patient Education  2019 [employer]vier Inc.  

## 2018-04-01 ENCOUNTER — Other Ambulatory Visit: Payer: Self-pay | Admitting: Physician Assistant

## 2018-04-01 DIAGNOSIS — E1169 Type 2 diabetes mellitus with other specified complication: Secondary | ICD-10-CM

## 2018-06-11 ENCOUNTER — Other Ambulatory Visit: Payer: Self-pay | Admitting: Physician Assistant

## 2018-06-11 DIAGNOSIS — I10 Essential (primary) hypertension: Secondary | ICD-10-CM

## 2018-06-12 ENCOUNTER — Ambulatory Visit: Payer: Self-pay | Admitting: Physician Assistant

## 2018-06-14 ENCOUNTER — Ambulatory Visit: Payer: Self-pay | Admitting: Physician Assistant

## 2018-06-28 ENCOUNTER — Ambulatory Visit: Payer: Self-pay | Admitting: Physician Assistant

## 2018-07-10 ENCOUNTER — Ambulatory Visit: Payer: Self-pay | Admitting: Physician Assistant

## 2018-07-10 ENCOUNTER — Other Ambulatory Visit: Payer: Self-pay

## 2018-07-10 ENCOUNTER — Encounter: Payer: Self-pay | Admitting: Physician Assistant

## 2018-07-10 VITALS — BP 130/83 | HR 89 | Ht 71.0 in | Wt 263.6 lb

## 2018-07-10 DIAGNOSIS — E1169 Type 2 diabetes mellitus with other specified complication: Secondary | ICD-10-CM

## 2018-07-10 DIAGNOSIS — R0982 Postnasal drip: Secondary | ICD-10-CM

## 2018-07-10 DIAGNOSIS — E1159 Type 2 diabetes mellitus with other circulatory complications: Secondary | ICD-10-CM

## 2018-07-10 DIAGNOSIS — B351 Tinea unguium: Secondary | ICD-10-CM

## 2018-07-10 DIAGNOSIS — I8311 Varicose veins of right lower extremity with inflammation: Secondary | ICD-10-CM

## 2018-07-10 DIAGNOSIS — E785 Hyperlipidemia, unspecified: Secondary | ICD-10-CM

## 2018-07-10 DIAGNOSIS — I152 Hypertension secondary to endocrine disorders: Secondary | ICD-10-CM

## 2018-07-10 DIAGNOSIS — I10 Essential (primary) hypertension: Secondary | ICD-10-CM

## 2018-07-10 DIAGNOSIS — I8312 Varicose veins of left lower extremity with inflammation: Secondary | ICD-10-CM

## 2018-07-10 DIAGNOSIS — F419 Anxiety disorder, unspecified: Secondary | ICD-10-CM

## 2018-07-10 LAB — POCT GLYCOSYLATED HEMOGLOBIN (HGB A1C): Hemoglobin A1C: 6.4 % — AB (ref 4.0–5.6)

## 2018-07-10 MED ORDER — ATORVASTATIN CALCIUM 10 MG PO TABS: 10.0000 mg | ORAL_TABLET | Freq: Every day | ORAL | 0 refills | Status: DC

## 2018-07-10 NOTE — Progress Notes (Signed)
Patient: Brent LootsBrian E Doster Male    DOB: 06/11/1975   43 y.o.   MRN: [id number] Visit Date: 07/19/2018  Today's Provider: Trey SailorsAdriana M Mahogany Torrance, PA-C   Chief Complaint  Patient presents with  . Diabetes    4 month f-up   Subjective:     HPI   Diabetes Mellitus Type II, Follow-up:   Lab Results  Component Value Date   HGBA1C 6.4 (A) 07/10/2018   HGBA1C 6.9 (A) 03/14/2018   HGBA1C 8.3 (A) 01/05/2018    Last seen for diabetes 3 months ago.  Management since then includes weight loss. Currently taking 1000 mg metformin BID. Working on [place] and exercise. Continues to lose weight.  He reports excellent compliance with treatment. He is not having side effects.  Current symptoms include none and have been unchanged.  Episodes of hypoglycemia? no   Current Insulin Regimen: None Most Recent Eye Exam: Has not yet scheduled with [facility]  Weight trend: decreasing steadily Prior visit with dietician: No Current exercise: not asked Current diet habits: well balanced   Pertinent Labs:    Component Value Date/Time   CHOL 179 11/29/2017 1456   TRIG 775 (HH) 11/29/2017 1456   HDL 32 (L) 11/29/2017 1456   LDLCALC Comment 11/29/2017 1456   CREATININE 0.72 (L) 11/29/2017 1456   HTN: Currently taking Lisinopril-HCTZ 20-12.5 mg daily without issue.  BP Readings from Last 3 Encounters:  07/10/18 130/83  03/14/18 113/76  01/05/18 129/82   Obesity: Weight is decreasing steadily. Patient is working on dietary changes, has decreased beer intake and been eating healthier.  Wt Readings from Last 3 Encounters:  07/10/18 263 lb 9.6 oz (119.6 kg)  03/14/18 270 lb (122.5 kg)  01/05/18 280 lb 9.6 oz (127.3 kg)   HLD: Is currently not on a statin.  Lipid Panel     Component Value Date/Time   CHOL 179 11/29/2017 1456   TRIG 775 (HH) 11/29/2017 1456   HDL 32 (L) 11/29/2017 1456   CHOLHDL 5.6 (H) 11/29/2017 1456   LDLCALC Comment 11/29/2017 1456   LDLDIRECT 76 11/29/2017 1456    Varicose veins: Reports significant varicosities on both legs, left worse than right. Did have an accident several years ago where he injured left leg with chain saw.   Anxiety and depression: Has been giving some consideration to his mood. He feels he can get very angry with his family. Worries about a lot. Will be pleasant with the people at his job but can sometimes have negative interactions with his family when at home. Smokes marijuana or drinks beer to help relax.    ------------------------------------------------------------------------   No Known Allergies   Current Outpatient Medications:  .  ACCU-CHEK FASTCLIX LANCETS MISC, USE TO CHECK BLOOD SUGAR EVERY MORNING, Disp: 102 each, Rfl: 0 .  lisinopril-hydrochlorothiazide (ZESTORETIC) 20-12.5 MG tablet, TAKE 1 TABLET BY MOUTH EVERY DAY IN THE MORNING, Disp: 30 tablet, Rfl: 2 .  atorvastatin (LIPITOR) 10 MG tablet, Take 1 tablet (10 mg total) by mouth daily., Disp: 90 tablet, Rfl: 0 .  metFORMIN (GLUCOPHAGE) 1000 MG tablet, TAKE 1 TABLET (1,000 MG TOTAL) BY MOUTH 2 (TWO) TIMES DAILY WITH A MEAL., Disp: 60 tablet, Rfl: 2  Review of Systems  All other systems reviewed and are negative.   Social History   Tobacco Use  . Smoking status: Never Smoker  . Smokeless tobacco: Never Used  Substance Use Topics  . Alcohol use: Yes    Comment: OCC  Objective:   BP 130/83 (BP Location: Left Arm, Patient Position: Sitting, Cuff Size: Large)   Pulse 89   Ht 5\' 11"  (1.803 m)   Wt 263 lb 9.6 oz (119.6 kg)   SpO2 98%   BMI 36.76 kg/m  Vitals:   07/10/18 1441  BP: 130/83  Pulse: 89  SpO2: 98%  Weight: 263 lb 9.6 oz (119.6 kg)  Height: 5\' 11"  (1.803 m)     Physical Exam Constitutional:      Appearance: Normal appearance.  Cardiovascular:     Rate and Rhythm: Normal rate and regular rhythm.     Heart sounds: Normal heart sounds.  Pulmonary:     Effort: Pulmonary effort is normal.     Breath sounds: Normal breath  sounds.  Musculoskeletal:     Comments: Large varicose veins on left leg.   Skin:    General: Skin is warm and dry.  Neurological:     Mental Status: He is alert and oriented to person, place, and time. Mental status is at baseline.  Psychiatric:        Mood and Affect: Mood normal.        Behavior: Behavior normal.         Assessment & Plan    1. Hyperlipidemia associated with type 2 diabetes mellitus (HCC)  - POCT glycosylated hemoglobin (Hb A1C) - atorvastatin (LIPITOR) 10 MG tablet; Take 1 tablet (10 mg total) by mouth daily.  Dispense: 90 tablet; Refill: 0  2. Type 2 diabetes mellitus with other specified complication, without long-term current use of insulin (HCC)  Improving, continue current medications. Needs to schedule eye exam.   3. Hypertension associated with diabetes ([place])  Controlled.  4. Varicose veins of both lower extremities with inflammation  Counseled on course of treatment. Can wear compression stockings, if not successful which it likely will not be, refer to vascular surgery.  5. PND (post-nasal drip)   6. Anxiety  Consider medication and counseling. He will think about this.  7. Onychomycosis  Will likely need podiatry and oral antifungal.  The entirety of the information documented in the History of Present Illness, Review of Systems and Physical Exam were personally obtained by me. Portions of this information were initially documented by Edd Arbour, CMA and reviewed by me for thoroughness and accuracy.   F/u 4 months     Trinna Post, PA-C  [facility]

## 2018-07-10 NOTE — Patient Instructions (Addendum)
SCHEDULE EYE EXAM  Anxiety and depression: zoloft, celexa, lexapro, prozac, paxil; buspar Fingernail fungus: onychomycosis   Diabetes Mellitus and Exercise Exercising regularly is important for your overall health, especially when you have diabetes (diabetes mellitus). Exercising is not only about losing weight. It has many other health benefits, such as increasing muscle strength and bone density and reducing body fat and stress. This leads to improved fitness, flexibility, and endurance, all of which result in better overall health. Exercise has additional benefits for people with diabetes, including:  Reducing appetite.  Helping to lower and control blood glucose.  Lowering blood pressure.  Helping to control amounts of fatty substances (lipids) in the blood, such as cholesterol and triglycerides.  Helping the body to respond better to insulin (improving insulin sensitivity).  Reducing how much insulin the body needs.  Decreasing the risk for heart disease by: ? Lowering cholesterol and triglyceride levels. ? Increasing the levels of good cholesterol. ? Lowering blood glucose levels. What is my activity plan? Your health care provider or certified diabetes educator can help you make a plan for the type and frequency of exercise (activity plan) that works for you. Make sure that you:  Do at least 150 minutes of moderate-intensity or vigorous-intensity exercise each week. This could be brisk walking, biking, or water aerobics. ? Do stretching and strength exercises, such as yoga or weightlifting, at least 2 times a week. ? Spread out your activity over at least 3 days of the week.  Get some form of physical activity every day. ? Do not go more than 2 days in a row without some kind of physical activity. ? Avoid being inactive for more than 30 minutes at a time. Take frequent breaks to walk or stretch.  Choose a type of exercise or activity that you enjoy, and set realistic  goals.  Start slowly, and gradually increase the intensity of your exercise over time. What do I need to know about managing my diabetes?   Check your blood glucose before and after exercising. ? If your blood glucose is 240 mg/dL (13.3 mmol/L) or higher before you exercise, check your urine for ketones. If you have ketones in your urine, do not exercise until your blood glucose returns to normal. ? If your blood glucose is 100 mg/dL (5.6 mmol/L) or lower, eat a snack containing 15-20 grams of carbohydrate. Check your blood glucose 15 minutes after the snack to make sure that your level is above 100 mg/dL (5.6 mmol/L) before you start your exercise.  Know the symptoms of low blood glucose (hypoglycemia) and how to treat it. Your risk for hypoglycemia increases during and after exercise. Common symptoms of hypoglycemia can include: ? Hunger. ? Anxiety. ? Sweating and feeling clammy. ? Confusion. ? Dizziness or feeling light-headed. ? Increased heart rate or palpitations. ? Blurry vision. ? Tingling or numbness around the mouth, lips, or tongue. ? Tremors or shakes. ? Irritability.  Keep a rapid-acting carbohydrate snack available before, during, and after exercise to help prevent or treat hypoglycemia.  Avoid injecting insulin into areas of the body that are going to be exercised. For example, avoid injecting insulin into: ? The arms, when playing tennis. ? The legs, when jogging.  Keep records of your exercise habits. Doing this can help you and your health care provider adjust your diabetes management plan as needed. Write down: ? Food that you eat before and after you exercise. ? Blood glucose levels before and after you exercise. ? The type  and amount of exercise you have done. ? When your insulin is expected to peak, if you use insulin. Avoid exercising at times when your insulin is peaking.  When you start a new exercise or activity, work with your health care provider to make  sure the activity is safe for you, and to adjust your insulin, medicines, or food intake as needed.  Drink plenty of water while you exercise to prevent dehydration or heat stroke. Drink enough fluid to keep your urine clear or pale yellow. Summary  Exercising regularly is important for your overall health, especially when you have diabetes (diabetes mellitus).  Exercising has many health benefits, such as increasing muscle strength and bone density and reducing body fat and stress.  Your health care provider or certified diabetes educator can help you make a plan for the type and frequency of exercise (activity plan) that works for you.  When you start a new exercise or activity, work with your health care provider to make sure the activity is safe for you, and to adjust your insulin, medicines, or food intake as needed. This information is not intended to replace advice given to you by your health care provider. Make sure you discuss any questions you have with your health care provider. Document Released: 04/10/2003 Document Revised: 07/29/2016 Document Reviewed: 06/30/2015 Elsevier Interactive Patient Education  2019 [employer]

## 2018-07-11 ENCOUNTER — Other Ambulatory Visit: Payer: Self-pay | Admitting: Physician Assistant

## 2018-07-11 DIAGNOSIS — E1169 Type 2 diabetes mellitus with other specified complication: Secondary | ICD-10-CM

## 2018-08-05 ENCOUNTER — Other Ambulatory Visit: Payer: Self-pay | Admitting: Physician Assistant

## 2018-08-05 DIAGNOSIS — I10 Essential (primary) hypertension: Secondary | ICD-10-CM

## 2018-08-14 ENCOUNTER — Telehealth: Payer: Self-pay | Admitting: Physician Assistant

## 2018-08-14 NOTE — Telephone Encounter (Signed)
Pt is requesting call back to discuss his medications and discuss the medication that Fabio Bering had discussed about adding a medication. Pt stated he doesn't know the name of the medication and when I tried to get more information pt stated to just please have [place] or her nurse return his call. Please advise. Thanks TNP

## 2018-08-15 ENCOUNTER — Ambulatory Visit (INDEPENDENT_AMBULATORY_CARE_PROVIDER_SITE_OTHER): Payer: 59 | Admitting: Physician Assistant

## 2018-08-15 DIAGNOSIS — F419 Anxiety disorder, unspecified: Secondary | ICD-10-CM | POA: Diagnosis not present

## 2018-08-15 MED ORDER — SERTRALINE HCL 50 MG PO TABS: 50.0000 mg | ORAL_TABLET | Freq: Every day | ORAL | 0 refills | Status: DC

## 2018-08-15 NOTE — Patient Instructions (Signed)

## 2018-08-15 NOTE — Progress Notes (Signed)
   Subjective:    Patient ID: Brent Compton, male    DOB: Aug 15, 1975, 43 y.o.   MRN: [id number]  Brent Compton is a 43 y.o. male presenting on 08/15/2018 for Anxiety  Virtual Visit via Telephone Note  I connected with Brent Compton on 08/15/18 at  3:40 PM EDT by telephone and verified that I am speaking with the correct person using two identifiers.   I discussed the limitations, risks, security and privacy concerns of performing an evaluation and management service by telephone and the availability of in person appointments. I also discussed with the patient that there may be a patient responsible charge related to this service. The patient expressed understanding and agreed to proceed.  Patient location: home Provider location: [place] office  Persons involved in the visit: patient, provider   HPI   Patient reports history of anxiety. Had discussed starting medication at last visit and he feels read to do so. Reports continue mood lability, irritability, inability to focus. He has never been on anxiety medication previously.   Social History   Tobacco Use  . Smoking status: Never Smoker  . Smokeless tobacco: Never Used  Substance Use Topics  . Alcohol use: Yes    Comment: OCC  . Drug use: No    Review of Systems Per HPI unless specifically indicated above     Objective:    There were no vitals taken for this visit.  Wt Readings from Last 3 Encounters:  07/10/18 263 lb 9.6 oz (119.6 kg)  03/14/18 270 lb (122.5 kg)  01/05/18 280 lb 9.6 oz (127.3 kg)    Physical Exam Results for orders placed or performed in visit on 07/10/18  POCT glycosylated hemoglobin (Hb A1C)  Result Value Ref Range   Hemoglobin A1C 6.4 (A) 4.0 - 5.6 %   HbA1c POC (<> result, manual entry)     HbA1c, POC (prediabetic range)     HbA1c, POC (controlled diabetic range)        Assessment & Plan:  1. Anxiety  We will start zoloft as below. He has a scheduled follow up in  September which we can use to assess his anxiety as well.   - sertraline (ZOLOFT) 50 MG tablet; Take 1 tablet (50 mg total) by mouth daily.  Dispense: 90 tablet; Refill: 0    Follow up plan: Return in about 2 months (around 10/16/2018) for anxiety .  Brent Collet, PA-C [facility] 08/15/2018, 4:49 PM

## 2018-08-15 NOTE — Telephone Encounter (Signed)
APPT TODAY

## 2018-08-24 ENCOUNTER — Telehealth: Payer: Self-pay

## 2018-08-24 DIAGNOSIS — Z20828 Contact with and (suspected) exposure to other viral communicable diseases: Secondary | ICD-10-CM

## 2018-08-24 DIAGNOSIS — Z20822 Contact with and (suspected) exposure to covid-19: Secondary | ICD-10-CM

## 2018-08-24 NOTE — Telephone Encounter (Signed)
Brent Compton, did you prefer to call this patient?

## 2018-08-24 NOTE — Telephone Encounter (Signed)
Patient and wife were exposed last Friday to someone that found out today that his COVID test is positive.  Both are asymptomatic but would like to get tested.  Can you order test for him and I have instructed her to call her PCP.  Once order is in let me know and I will call them. Thanks

## 2018-08-24 NOTE — Telephone Encounter (Signed)
Advised 

## 2018-08-24 NOTE — Telephone Encounter (Signed)
Order placed. He should quarantine for 14 days from exposure. If he is positive, he should quarantine 10 days from when symptoms first started OR from date of positive test.

## 2018-09-11 ENCOUNTER — Other Ambulatory Visit: Payer: Self-pay | Admitting: Physician Assistant

## 2018-09-11 DIAGNOSIS — E1169 Type 2 diabetes mellitus with other specified complication: Secondary | ICD-10-CM

## 2018-09-11 DIAGNOSIS — E785 Hyperlipidemia, unspecified: Secondary | ICD-10-CM

## 2018-10-10 ENCOUNTER — Ambulatory Visit: Payer: Self-pay | Admitting: Physician Assistant

## 2018-10-11 ENCOUNTER — Other Ambulatory Visit: Payer: Self-pay

## 2018-10-11 ENCOUNTER — Ambulatory Visit (INDEPENDENT_AMBULATORY_CARE_PROVIDER_SITE_OTHER): Payer: 59 | Admitting: Physician Assistant

## 2018-10-11 ENCOUNTER — Encounter: Payer: Self-pay | Admitting: Physician Assistant

## 2018-10-11 VITALS — BP 138/86 | HR 82 | Temp 95.5°F | Resp 16 | Wt 274.0 lb

## 2018-10-11 DIAGNOSIS — F419 Anxiety disorder, unspecified: Secondary | ICD-10-CM

## 2018-10-11 DIAGNOSIS — E1169 Type 2 diabetes mellitus with other specified complication: Secondary | ICD-10-CM | POA: Diagnosis not present

## 2018-10-11 DIAGNOSIS — E1159 Type 2 diabetes mellitus with other circulatory complications: Secondary | ICD-10-CM

## 2018-10-11 DIAGNOSIS — I10 Essential (primary) hypertension: Secondary | ICD-10-CM

## 2018-10-11 DIAGNOSIS — F329 Major depressive disorder, single episode, unspecified: Secondary | ICD-10-CM

## 2018-10-11 DIAGNOSIS — I152 Hypertension secondary to endocrine disorders: Secondary | ICD-10-CM

## 2018-10-11 DIAGNOSIS — F32A Depression, unspecified: Secondary | ICD-10-CM

## 2018-10-11 DIAGNOSIS — E785 Hyperlipidemia, unspecified: Secondary | ICD-10-CM

## 2018-10-11 LAB — POCT GLYCOSYLATED HEMOGLOBIN (HGB A1C)
Est. average glucose Bld gHb Est-mCnc: 131
Hemoglobin A1C: 6.2 % — AB (ref 4.0–5.6)

## 2018-10-11 MED ORDER — SERTRALINE HCL 100 MG PO TABS: 100.0000 mg | ORAL_TABLET | Freq: Every day | ORAL | 1 refills | Status: DC

## 2018-10-11 MED ORDER — LISINOPRIL-HYDROCHLOROTHIAZIDE 20-12.5 MG PO TABS
ORAL_TABLET | ORAL | 2 refills | Status: DC
Start: 2018-10-11 — End: 2019-01-17

## 2018-10-11 MED ORDER — ATORVASTATIN CALCIUM 10 MG PO TABS: 10.0000 mg | ORAL_TABLET | Freq: Every day | ORAL | 2 refills | Status: DC

## 2018-10-11 MED ORDER — METFORMIN HCL 1000 MG PO TABS: 1000.0000 mg | ORAL_TABLET | Freq: Two times a day (BID) | ORAL | 2 refills | Status: DC

## 2018-10-11 NOTE — Progress Notes (Signed)
Patient: Brent Compton Male    DOB: 03-Jul-1975   43 y.o.   MRN: [id number] Visit Date: 10/11/2018  Today's Provider: Trey Sailors, PA-C   Chief Complaint  Patient presents with  . Diabetes   Subjective:     HPI    Diabetes Mellitus Type II, Follow-up:   Lab Results  Component Value Date   HGBA1C 6.2 (A) 10/11/2018   HGBA1C 6.4 (A) 07/10/2018   HGBA1C 6.9 (A) 03/14/2018   Last seen for diabetes 3 months ago.  Management since then includes; no changes. He reports good compliance with treatment. He is not having side effects.  Current symptoms include none and have been stable. Home blood sugar records: blood sugars are not check at home  Episodes of hypoglycemia? no   Current Insulin Regimen: none Most Recent Eye Exam: Has not gotten. Last time, Dr. Clydene Pugh was not accepting patients in person due to COVID.  Weight trend: fluctuating a bit Prior visit with dietician: no Current diet: regular diet Current exercise: none  ------------------------------------------------------------------------   Hypertension, follow-up:  BP Readings from Last 3 Encounters:  10/11/18 138/86  07/10/18 130/83  03/14/18 113/76    He was last seen for hypertension 3 months ago.  BP at that visit was 130/83. Management since that visit includes; no changes.He reports good compliance with treatment. He is not having side effects.  He is not exercising. He is not adherent to low salt diet.   Outside blood pressures are not checked. He is experiencing none.  Patient denies chest pain, chest pressure/discomfort, claudication, dyspnea, exertional chest pressure/discomfort, fatigue, irregular heart beat, lower extremity edema, near-syncope, orthopnea, palpitations, paroxysmal nocturnal dyspnea, syncope and tachypnea.   Cardiovascular risk factors include diabetes mellitus, hypertension and male gender.  Use of agents associated with hypertension: none.    ------------------------------------------------------------------------    Lipid/Cholesterol, Follow-up:   Last seen for this 3 months ago.  Management since that visit includes; no changes.  Last Lipid Panel:    Component Value Date/Time   CHOL 179 11/29/2017 1456   TRIG 775 (HH) 11/29/2017 1456   HDL 32 (L) 11/29/2017 1456   CHOLHDL 5.6 (H) 11/29/2017 1456   LDLCALC Comment 11/29/2017 1456   LDLDIRECT 76 11/29/2017 1456    He reports good compliance with treatment. He is not having side effects.   Has gained 11 pounds since last visit. Still down overall from 294 lb starting weight.   Wt Readings from Last 3 Encounters:  10/11/18 274 lb (124.3 kg)  07/10/18 263 lb 9.6 oz (119.6 kg)  03/14/18 270 lb (122.5 kg)    ------------------------------------------------------------------------   Follow up for Anxiety:  The patient was last seen for this 2 months ago. Changes made at last visit include starting Zoloft.  He reports good compliance with treatment. Reports his mood has slightly improved but is not quite where he wants it.  He is not having side effects.   ------------------------------------------------------------------------------------   No Known Allergies   Current Outpatient Medications:  .  ACCU-CHEK FASTCLIX LANCETS MISC, USE TO CHECK BLOOD SUGAR EVERY MORNING, Disp: 102 each, Rfl: 0 .  atorvastatin (LIPITOR) 10 MG tablet, TAKE 1 TABLET BY MOUTH EVERY DAY, Disp: 30 tablet, Rfl: 2 .  lisinopril-hydrochlorothiazide (ZESTORETIC) 20-12.5 MG tablet, TAKE 1 TABLET BY MOUTH EVERY DAY IN THE MORNING, Disp: 30 tablet, Rfl: 2 .  metFORMIN (GLUCOPHAGE) 1000 MG tablet, TAKE 1 TABLET (1,000 MG TOTAL) BY MOUTH 2 (TWO) TIMES DAILY WITH  A MEAL., Disp: 60 tablet, Rfl: 2 .  sertraline (ZOLOFT) 100 MG tablet, Take 1 tablet (100 mg total) by mouth daily., Disp: 90 tablet, Rfl: 1  Review of Systems  Constitutional: Negative for appetite change, chills and fever.   Respiratory: Negative for chest tightness, shortness of breath and wheezing.   Cardiovascular: Negative for chest pain and palpitations.  Gastrointestinal: Negative for abdominal pain, nausea and vomiting.    Social History   Tobacco Use  . Smoking status: Never Smoker  . Smokeless tobacco: Never Used  Substance Use Topics  . Alcohol use: Yes    Comment: OCC      Objective:   BP 138/86   Pulse 82   Temp (!) 95.5 F (35.3 C) (Temporal)   Resp 16   Wt 274 lb (124.3 kg)   SpO2 97% Comment: room air  BMI 38.22 kg/m  Vitals:   10/11/18 1623  BP: 138/86  Pulse: 82  Resp: 16  Temp: (!) 95.5 F (35.3 C)  TempSrc: Temporal  SpO2: 97%  Weight: 274 lb (124.3 kg)  Body mass index is 38.22 kg/m.   Physical Exam Constitutional:      Appearance: Normal appearance.  Cardiovascular:     Rate and Rhythm: Normal rate and regular rhythm.     Heart sounds: Normal heart sounds.  Pulmonary:     Effort: Pulmonary effort is normal.     Breath sounds: Normal breath sounds.  Skin:    General: Skin is warm and dry.  Neurological:     Mental Status: He is alert and oriented to person, place, and time. Mental status is at baseline.  Psychiatric:        Mood and Affect: Mood normal.        Behavior: Behavior normal.      Results for orders placed or performed in visit on 10/11/18  POCT HgB A1C  Result Value Ref Range   Hemoglobin A1C 6.2 (A) 4.0 - 5.6 %   HbA1c POC (<> result, manual entry)     HbA1c, POC (prediabetic range)     HbA1c, POC (controlled diabetic range)     Est. average glucose Bld gHb Est-mCnc 131        Assessment & Plan    1. Type 2 diabetes mellitus with other specified complication, without long-term current use of insulin (HCC)  Remains controlled. Be cautious about weight gain. He has been drinking more beer than usual.   - POCT HgB A1C - metFORMIN (GLUCOPHAGE) 1000 MG tablet; Take 1 tablet (1,000 mg total) by mouth 2 (two) times daily with a meal.   Dispense: 60 tablet; Refill: 2  2. Anxiety and depression  We will increase as below.  - sertraline (ZOLOFT) 100 MG tablet; Take 1 tablet (100 mg total) by mouth daily.  Dispense: 90 tablet; Refill: 1  3. Hypertension associated with diabetes (HCC)   4. Hyperlipidemia associated with type 2 diabetes mellitus (HCC)  - atorvastatin (LIPITOR) 10 MG tablet; Take 1 tablet (10 mg total) by mouth daily.  Dispense: 30 tablet; Refill: 2  5. Hypertension, unspecified type  - lisinopril-hydrochlorothiazide (ZESTORETIC) 20-12.5 MG tablet; TAKE 1 TABLET BY MOUTH EVERY DAY IN THE MORNING  Dispense: 30 tablet; Refill: 2  The entirety of the information documented in the History of Present Illness, Review of Systems and Physical Exam were personally obtained by me. Portions of this information were initially documented by April M. Hyacinth MeekerMiller, CMA and reviewed by me for thoroughness and accuracy.  F/u 3 months for diabetes and anxiety.     Trinna Post, PA-C  [facility]

## 2018-10-11 NOTE — Patient Instructions (Signed)
Diabetes Mellitus and Exercise Exercising regularly is important for your overall health, especially when you have diabetes (diabetes mellitus). Exercising is not only about losing weight. It has many other health benefits, such as increasing muscle strength and bone density and reducing body fat and stress. This leads to improved fitness, flexibility, and endurance, all of which result in better overall health. Exercise has additional benefits for people with diabetes, including:  Reducing appetite.  Helping to lower and control blood glucose.  Lowering blood pressure.  Helping to control amounts of fatty substances (lipids) in the blood, such as cholesterol and triglycerides.  Helping the body to respond better to insulin (improving insulin sensitivity).  Reducing how much insulin the body needs.  Decreasing the risk for heart disease by: ? Lowering cholesterol and triglyceride levels. ? Increasing the levels of good cholesterol. ? Lowering blood glucose levels. What is my activity plan? Your health care provider or certified diabetes educator can help you make a plan for the type and frequency of exercise (activity plan) that works for you. Make sure that you:  Do at least 150 minutes of moderate-intensity or vigorous-intensity exercise each week. This could be brisk walking, biking, or water aerobics. ? Do stretching and strength exercises, such as yoga or weightlifting, at least 2 times a week. ? Spread out your activity over at least 3 days of the week.  Get some form of physical activity every day. ? Do not go more than 2 days in a row without some kind of physical activity. ? Avoid being inactive for more than 30 minutes at a time. Take frequent breaks to walk or stretch.  Choose a type of exercise or activity that you enjoy, and set realistic goals.  Start slowly, and gradually increase the intensity of your exercise over time. What do I need to know about managing my  diabetes?   Check your blood glucose before and after exercising. ? If your blood glucose is 240 mg/dL (13.3 mmol/L) or higher before you exercise, check your urine for ketones. If you have ketones in your urine, do not exercise until your blood glucose returns to normal. ? If your blood glucose is 100 mg/dL (5.6 mmol/L) or lower, eat a snack containing 15-20 grams of carbohydrate. Check your blood glucose 15 minutes after the snack to make sure that your level is above 100 mg/dL (5.6 mmol/L) before you start your exercise.  Know the symptoms of low blood glucose (hypoglycemia) and how to treat it. Your risk for hypoglycemia increases during and after exercise. Common symptoms of hypoglycemia can include: ? Hunger. ? Anxiety. ? Sweating and feeling clammy. ? Confusion. ? Dizziness or feeling light-headed. ? Increased heart rate or palpitations. ? Blurry vision. ? Tingling or numbness around the mouth, lips, or tongue. ? Tremors or shakes. ? Irritability.  Keep a rapid-acting carbohydrate snack available before, during, and after exercise to help prevent or treat hypoglycemia.  Avoid injecting insulin into areas of the body that are going to be exercised. For example, avoid injecting insulin into: ? The arms, when playing tennis. ? The legs, when jogging.  Keep records of your exercise habits. Doing this can help you and your health care provider adjust your diabetes management plan as needed. Write down: ? Food that you eat before and after you exercise. ? Blood glucose levels before and after you exercise. ? The type and amount of exercise you have done. ? When your insulin is expected to peak, if you use   insulin. Avoid exercising at times when your insulin is peaking.  When you start a new exercise or activity, work with your health care provider to make sure the activity is safe for you, and to adjust your insulin, medicines, or food intake as needed.  Drink plenty of water while  you exercise to prevent dehydration or heat stroke. Drink enough fluid to keep your urine clear or pale yellow. Summary  Exercising regularly is important for your overall health, especially when you have diabetes (diabetes mellitus).  Exercising has many health benefits, such as increasing muscle strength and bone density and reducing body fat and stress.  Your health care provider or certified diabetes educator can help you make a plan for the type and frequency of exercise (activity plan) that works for you.  When you start a new exercise or activity, work with your health care provider to make sure the activity is safe for you, and to adjust your insulin, medicines, or food intake as needed. This information is not intended to replace advice given to you by your health care provider. Make sure you discuss any questions you have with your health care provider. Document Released: 04/10/2003 Document Revised: 08/12/2016 Document Reviewed: 06/30/2015 Elsevier Patient Education  2020 [employer]  

## 2019-01-10 ENCOUNTER — Ambulatory Visit (INDEPENDENT_AMBULATORY_CARE_PROVIDER_SITE_OTHER): Payer: 59 | Admitting: Physician Assistant

## 2019-01-10 ENCOUNTER — Other Ambulatory Visit: Payer: Self-pay

## 2019-01-10 ENCOUNTER — Encounter: Payer: Self-pay | Admitting: Physician Assistant

## 2019-01-10 VITALS — BP 135/94 | HR 86 | Temp 96.8°F | Wt 281.6 lb

## 2019-01-10 DIAGNOSIS — E1159 Type 2 diabetes mellitus with other circulatory complications: Secondary | ICD-10-CM

## 2019-01-10 DIAGNOSIS — F419 Anxiety disorder, unspecified: Secondary | ICD-10-CM

## 2019-01-10 DIAGNOSIS — E785 Hyperlipidemia, unspecified: Secondary | ICD-10-CM

## 2019-01-10 DIAGNOSIS — E1169 Type 2 diabetes mellitus with other specified complication: Secondary | ICD-10-CM | POA: Diagnosis not present

## 2019-01-10 NOTE — Progress Notes (Signed)
Patient: Brent Compton Male    DOB: 04/08/1975   43 y.o.   MRN: [id number] Visit Date: 01/16/2019  Today's Provider: Trey SailorsAdriana M Pollak, PA-C   Chief Complaint  Patient presents with  . Diabetes  . Anxiety   Subjective:     HPI  Diabetes Mellitus Type II, Follow-up:   Lab Results  Component Value Date   HGBA1C 6.3 (H) 01/10/2019   HGBA1C 6.2 (A) 10/11/2018   HGBA1C 6.4 (A) 07/10/2018    Last seen for diabetes 3 months ago.  Management since then includes none. He reports good compliance with treatment. He continues with metformin 1000 mg BID without issue.  He is not having side effects.  Current symptoms include none and have been stable. Home blood sugar records: not checked at home.  Episodes of hypoglycemia? no   Current insulin regiment: Is not on insulin Most Recent Eye Exam: Not performed. Patient has to schedule this.  Weight trend: stable Prior visit with dietician: No Current exercise: no regular exercise Current diet habits: well balanced  Pertinent Labs:    Component Value Date/Time   CHOL 164 01/10/2019 1536   TRIG 456 (H) 01/10/2019 1536   HDL 48 01/10/2019 1536   LDLCALC 49 01/10/2019 1536   CREATININE 0.69 (L) 01/10/2019 1536    Wt Readings from Last 3 Encounters:  01/10/19 281 lb 9.6 oz (127.7 kg)  10/11/18 274 lb (124.3 kg)  07/10/18 263 lb 9.6 oz (119.6 kg)    ------------------------------------------------------------------------  Anxiety Patient presents today for anxiety follow-up. Last office visit was on 10/11/2018 and his Zoloft was increased to 100 MG. Patient reports good compliance with treatment.  GAD 7 : Generalized Anxiety Score 01/10/2019  Nervous, Anxious, on Edge 0  Control/stop worrying 0  Worry too much - different things 0  Trouble relaxing 0  Restless 0  Easily annoyed or irritable 0  Afraid - awful might happen 0  Total GAD 7 Score 0  Anxiety Difficulty Not difficult at all   HLD: Continues lipitor 10  mg QHS.   Lipid Panel     Component Value Date/Time   CHOL 164 01/10/2019 1536   TRIG 456 (H) 01/10/2019 1536   HDL 48 01/10/2019 1536   CHOLHDL 3.4 01/10/2019 1536   LDLCALC 49 01/10/2019 1536   LDLDIRECT 76 11/29/2017 1456   LABVLDL 67 (H) 01/10/2019 1536      No Known Allergies   Current Outpatient Medications:  .  atorvastatin (LIPITOR) 10 MG tablet, Take 1 tablet (10 mg total) by mouth daily., Disp: 30 tablet, Rfl: 2 .  lisinopril-hydrochlorothiazide (ZESTORETIC) 20-12.5 MG tablet, TAKE 1 TABLET BY MOUTH EVERY DAY IN THE MORNING, Disp: 30 tablet, Rfl: 2 .  sertraline (ZOLOFT) 100 MG tablet, Take 1 tablet (100 mg total) by mouth daily., Disp: 90 tablet, Rfl: 1 .  metFORMIN (GLUCOPHAGE) 1000 MG tablet, Take 1 tablet (1,000 mg total) by mouth 2 (two) times daily with a meal., Disp: 60 tablet, Rfl: 2  Review of Systems  Constitutional: Negative.   Respiratory: Negative.   Cardiovascular: Negative.   Neurological: Negative.   Psychiatric/Behavioral: Negative.     Social History   Tobacco Use  . Smoking status: Never Smoker  . Smokeless tobacco: Never Used  Substance Use Topics  . Alcohol use: Yes    Comment: OCC      Objective:   BP (!) 135/94 (BP Location: Left Arm, Patient Position: Sitting, Cuff Size: Large)  Pulse 86   Temp (!) 96.8 F (36 C) (Temporal)   Wt 281 lb 9.6 oz (127.7 kg)   BMI 39.28 kg/m  Vitals:   01/10/19 1545  BP: (!) 135/94  Pulse: 86  Temp: (!) 96.8 F (36 C)  TempSrc: Temporal  Weight: 281 lb 9.6 oz (127.7 kg)  Body mass index is 39.28 kg/m.   Physical Exam Constitutional:      Appearance: Normal appearance.  Cardiovascular:     Rate and Rhythm: Normal rate and regular rhythm.     Heart sounds: Normal heart sounds.  Pulmonary:     Effort: Pulmonary effort is normal.     Breath sounds: Normal breath sounds.  Skin:    General: Skin is warm and dry.  Neurological:     Mental Status: He is alert and oriented to person,  place, and time. Mental status is at baseline.  Psychiatric:        Mood and Affect: Mood normal.        Behavior: Behavior normal.      Results for orders placed or performed in visit on 01/10/19  HgB A1c  Result Value Ref Range   Hgb A1c MFr Bld 6.3 (H) 4.8 - 5.6 %   Est. average glucose Bld gHb Est-mCnc 134 mg/dL  Comprehensive Metabolic Panel (CMET)  Result Value Ref Range   Glucose 137 (H) 65 - 99 mg/dL   BUN 18 6 - 24 mg/dL   Creatinine, Ser 0.69 (L) 0.76 - 1.27 mg/dL   GFR calc non Af Amer 116 >59 mL/min/1.73   GFR calc Af Amer 134 >59 mL/min/1.73   BUN/Creatinine Ratio 26 (H) 9 - 20   Sodium 137 134 - 144 mmol/L   Potassium 4.0 3.5 - 5.2 mmol/L   Chloride 98 96 - 106 mmol/L   CO2 22 20 - 29 mmol/L   Calcium 9.6 8.7 - 10.2 mg/dL   Total Protein 7.2 6.0 - 8.5 g/dL   Albumin 4.7 4.0 - 5.0 g/dL   Globulin, Total 2.5 1.5 - 4.5 g/dL   Albumin/Globulin Ratio 1.9 1.2 - 2.2   Bilirubin Total 0.2 0.0 - 1.2 mg/dL   Alkaline Phosphatase 77 39 - 117 IU/L   AST 19 0 - 40 IU/L   ALT 26 0 - 44 IU/L  Lipid Profile  Result Value Ref Range   Cholesterol, Total 164 100 - 199 mg/dL   Triglycerides 456 (H) 0 - 149 mg/dL   HDL 48 >39 mg/dL   VLDL Cholesterol Cal 67 (H) 5 - 40 mg/dL   LDL Chol Calc (NIH) 49 0 - 99 mg/dL   Chol/HDL Ratio 3.4 0.0 - 5.0 ratio  TSH  Result Value Ref Range   TSH 1.580 0.450 - 4.500 uIU/mL  CBC with Differential  Result Value Ref Range   WBC 9.6 3.4 - 10.8 x10E3/uL   RBC 5.12 4.14 - 5.80 x10E6/uL   Hemoglobin 16.4 13.0 - 17.7 g/dL   Hematocrit 46.0 37.5 - 51.0 %   MCV 90 79 - 97 fL   MCH 32.0 26.6 - 33.0 pg   MCHC 35.7 31.5 - 35.7 g/dL   RDW 12.6 11.6 - 15.4 %   Platelets 245 150 - 450 x10E3/uL   Neutrophils 54 Not Estab. %   Lymphs 33 Not Estab. %   Monocytes 7 Not Estab. %   Eos 4 Not Estab. %   Basos 1 Not Estab. %   Neutrophils Absolute 5.3 1.4 - 7.0 x10E3/uL  Lymphocytes Absolute 3.1 0.7 - 3.1 x10E3/uL   Monocytes Absolute 0.7 0.1 - 0.9  x10E3/uL   EOS (ABSOLUTE) 0.4 0.0 - 0.4 x10E3/uL   Basophils Absolute 0.1 0.0 - 0.2 x10E3/uL   Immature Granulocytes 1 Not Estab. %   Immature Grans (Abs) 0.1 0.0 - 0.1 x10E3/uL       Assessment & Plan    1. Type 2 diabetes mellitus with other specified complication, without long-term current use of insulin (HCC)  Controlled, continue medication. Patient with elevated blood pressure today. Will recheck next visit. He needs to schedule eye exam.   - HgB A1c - Comprehensive Metabolic Panel (CMET) - Lipid Profile - TSH - CBC with Differential  2. Hyperlipidemia associated with type 2 diabetes mellitus (HCC)  Continue lipitor.   - HgB A1c - Comprehensive Metabolic Panel (CMET) - Lipid Profile - TSH - CBC with Differential  3. Anxiety  Continue zoloft 100 mg daily.       Trey Sailors, PA-C  [facility]

## 2019-01-10 NOTE — Patient Instructions (Signed)
Diabetes Mellitus and Exercise Exercising regularly is important for your overall health, especially when you have diabetes (diabetes mellitus). Exercising is not only about losing weight. It has many other health benefits, such as increasing muscle strength and bone density and reducing body fat and stress. This leads to improved fitness, flexibility, and endurance, all of which result in better overall health. Exercise has additional benefits for people with diabetes, including:  Reducing appetite.  Helping to lower and control blood glucose.  Lowering blood pressure.  Helping to control amounts of fatty substances (lipids) in the blood, such as cholesterol and triglycerides.  Helping the body to respond better to insulin (improving insulin sensitivity).  Reducing how much insulin the body needs.  Decreasing the risk for heart disease by: ? Lowering cholesterol and triglyceride levels. ? Increasing the levels of good cholesterol. ? Lowering blood glucose levels. What is my activity plan? Your health care provider or certified diabetes educator can help you make a plan for the type and frequency of exercise (activity plan) that works for you. Make sure that you:  Do at least 150 minutes of moderate-intensity or vigorous-intensity exercise each week. This could be brisk walking, biking, or water aerobics. ? Do stretching and strength exercises, such as yoga or weightlifting, at least 2 times a week. ? Spread out your activity over at least 3 days of the week.  Get some form of physical activity every day. ? Do not go more than 2 days in a row without some kind of physical activity. ? Avoid being inactive for more than 30 minutes at a time. Take frequent breaks to walk or stretch.  Choose a type of exercise or activity that you enjoy, and set realistic goals.  Start slowly, and gradually increase the intensity of your exercise over time. What do I need to know about managing my  diabetes?   Check your blood glucose before and after exercising. ? If your blood glucose is 240 mg/dL (13.3 mmol/L) or higher before you exercise, check your urine for ketones. If you have ketones in your urine, do not exercise until your blood glucose returns to normal. ? If your blood glucose is 100 mg/dL (5.6 mmol/L) or lower, eat a snack containing 15-20 grams of carbohydrate. Check your blood glucose 15 minutes after the snack to make sure that your level is above 100 mg/dL (5.6 mmol/L) before you start your exercise.  Know the symptoms of low blood glucose (hypoglycemia) and how to treat it. Your risk for hypoglycemia increases during and after exercise. Common symptoms of hypoglycemia can include: ? Hunger. ? Anxiety. ? Sweating and feeling clammy. ? Confusion. ? Dizziness or feeling light-headed. ? Increased heart rate or palpitations. ? Blurry vision. ? Tingling or numbness around the mouth, lips, or tongue. ? Tremors or shakes. ? Irritability.  Keep a rapid-acting carbohydrate snack available before, during, and after exercise to help prevent or treat hypoglycemia.  Avoid injecting insulin into areas of the body that are going to be exercised. For example, avoid injecting insulin into: ? The arms, when playing tennis. ? The legs, when jogging.  Keep records of your exercise habits. Doing this can help you and your health care provider adjust your diabetes management plan as needed. Write down: ? Food that you eat before and after you exercise. ? Blood glucose levels before and after you exercise. ? The type and amount of exercise you have done. ? When your insulin is expected to peak, if you use   insulin. Avoid exercising at times when your insulin is peaking.  When you start a new exercise or activity, work with your health care provider to make sure the activity is safe for you, and to adjust your insulin, medicines, or food intake as needed.  Drink plenty of water while  you exercise to prevent dehydration or heat stroke. Drink enough fluid to keep your urine clear or pale yellow. Summary  Exercising regularly is important for your overall health, especially when you have diabetes (diabetes mellitus).  Exercising has many health benefits, such as increasing muscle strength and bone density and reducing body fat and stress.  Your health care provider or certified diabetes educator can help you make a plan for the type and frequency of exercise (activity plan) that works for you.  When you start a new exercise or activity, work with your health care provider to make sure the activity is safe for you, and to adjust your insulin, medicines, or food intake as needed. This information is not intended to replace advice given to you by your health care provider. Make sure you discuss any questions you have with your health care provider. Document Released: 04/10/2003 Document Revised: 08/12/2016 Document Reviewed: 06/30/2015 Elsevier Patient Education  2020 [employer]  

## 2019-01-11 LAB — CBC WITH DIFFERENTIAL/PLATELET
Basophils Absolute: 0.1 10*3/uL (ref 0.0–0.2)
Basos: 1 %
EOS (ABSOLUTE): 0.4 10*3/uL (ref 0.0–0.4)
Eos: 4 %
Hematocrit: 46 % (ref 37.5–51.0)
Hemoglobin: 16.4 g/dL (ref 13.0–17.7)
Immature Grans (Abs): 0.1 10*3/uL (ref 0.0–0.1)
Immature Granulocytes: 1 %
Lymphocytes Absolute: 3.1 10*3/uL (ref 0.7–3.1)
Lymphs: 33 %
MCH: 32 pg (ref 26.6–33.0)
MCHC: 35.7 g/dL (ref 31.5–35.7)
MCV: 90 fL (ref 79–97)
Monocytes Absolute: 0.7 10*3/uL (ref 0.1–0.9)
Monocytes: 7 %
Neutrophils Absolute: 5.3 10*3/uL (ref 1.4–7.0)
Neutrophils: 54 %
Platelets: 245 10*3/uL (ref 150–450)
RBC: 5.12 x10E6/uL (ref 4.14–5.80)
RDW: 12.6 % (ref 11.6–15.4)
WBC: 9.6 10*3/uL (ref 3.4–10.8)

## 2019-01-11 LAB — COMPREHENSIVE METABOLIC PANEL
ALT: 26 IU/L (ref 0–44)
AST: 19 IU/L (ref 0–40)
Albumin/Globulin Ratio: 1.9 (ref 1.2–2.2)
Albumin: 4.7 g/dL (ref 4.0–5.0)
Alkaline Phosphatase: 77 IU/L (ref 39–117)
BUN/Creatinine Ratio: 26 — ABNORMAL HIGH (ref 9–20)
BUN: 18 mg/dL (ref 6–24)
Bilirubin Total: 0.2 mg/dL (ref 0.0–1.2)
CO2: 22 mmol/L (ref 20–29)
Calcium: 9.6 mg/dL (ref 8.7–10.2)
Chloride: 98 mmol/L (ref 96–106)
Creatinine, Ser: 0.69 mg/dL — ABNORMAL LOW (ref 0.76–1.27)
GFR calc Af Amer: 134 mL/min/{1.73_m2} (ref >59)
GFR calc non Af Amer: 116 mL/min/{1.73_m2} (ref >59)
Globulin, Total: 2.5 g/dL (ref 1.5–4.5)
Glucose: 137 mg/dL — ABNORMAL HIGH (ref 65–99)
Potassium: 4 mmol/L (ref 3.5–5.2)
Sodium: 137 mmol/L (ref 134–144)
Total Protein: 7.2 g/dL (ref 6.0–8.5)

## 2019-01-11 LAB — LIPID PANEL
Chol/HDL Ratio: 3.4 ratio (ref 0.0–5.0)
Cholesterol, Total: 164 mg/dL (ref 100–199)
HDL: 48 mg/dL (ref >39)
LDL Chol Calc (NIH): 49 mg/dL (ref 0–99)
Triglycerides: 456 mg/dL — ABNORMAL HIGH (ref 0–149)
VLDL Cholesterol Cal: 67 mg/dL — ABNORMAL HIGH (ref 5–40)

## 2019-01-11 LAB — HEMOGLOBIN A1C
Est. average glucose Bld gHb Est-mCnc: 134 mg/dL
Hgb A1c MFr Bld: 6.3 % — ABNORMAL HIGH (ref 4.8–5.6)

## 2019-01-11 LAB — TSH: TSH: 1.58 u[IU]/mL (ref 0.450–4.500)

## 2019-01-12 ENCOUNTER — Telehealth: Payer: Self-pay | Admitting: *Deleted

## 2019-01-12 NOTE — Telephone Encounter (Signed)
No answer and vm. Okay for [facility] to give pt results.

## 2019-01-12 NOTE — Telephone Encounter (Signed)
-----   Message from Trinna Post, [place] sent at 01/12/2019  9:06 AM EST ----- A1c is stable at 6.3 and well controlled. Cholesterol looking a little better than last time. Remaining labwork stable. Don't forget to schedule eye exam.

## 2019-01-17 ENCOUNTER — Other Ambulatory Visit: Payer: Self-pay | Admitting: Physician Assistant

## 2019-01-17 DIAGNOSIS — I10 Essential (primary) hypertension: Secondary | ICD-10-CM

## 2019-01-17 DIAGNOSIS — E785 Hyperlipidemia, unspecified: Secondary | ICD-10-CM

## 2019-01-17 DIAGNOSIS — E1169 Type 2 diabetes mellitus with other specified complication: Secondary | ICD-10-CM

## 2019-01-17 NOTE — Telephone Encounter (Signed)
Requested Prescriptions  Pending Prescriptions Disp Refills  . atorvastatin (LIPITOR) 10 MG tablet [Pharmacy Med Name: ATORVASTATIN 10 MG TABLET] 30 tablet 2    Sig: TAKE 1 TABLET BY MOUTH EVERY DAY     Cardiovascular:  Antilipid - Statins Failed - 01/17/2019  1:02 AM      Failed - LDL in normal range and within 360 days    LDL Chol Calc (NIH)  Date Value Ref Range Status  01/10/2019 49 0 - 99 mg/dL Final   LDL Direct  Date Value Ref Range Status  11/29/2017 76 0 - 99 mg/dL Final         Failed - Triglycerides in normal range and within 360 days    Triglycerides  Date Value Ref Range Status  01/10/2019 456 (H) 0 - 149 mg/dL Final         Passed - Total Cholesterol in normal range and within 360 days    Cholesterol, Total  Date Value Ref Range Status  01/10/2019 164 100 - 199 mg/dL Final         Passed - HDL in normal range and within 360 days    HDL  Date Value Ref Range Status  01/10/2019 48 >39 mg/dL Final         Passed - Patient is not pregnant      Passed - Valid encounter within last 12 months    Recent Outpatient Visits          1 week ago Type 2 diabetes mellitus with other specified complication, without long-term current use of insulin [facility])   [place], Adriana M, PA-C   3 months ago Type 2 diabetes mellitus with other specified complication, without long-term current use of insulin [facility])   [place], [place], [place]   5 months ago [place], [place], PA-C   6 months ago Hyperlipidemia associated with type 2 diabetes mellitus [facility])   [place], [place], PA-C   10 months ago Type 2 diabetes mellitus with other specified complication, without long-term current use of insulin [facility])   [place], Wendee Beavers, PA-C      Future Appointments            In 2 months Pollak, Adriana M, PA-C [employer], [facility]            . metFORMIN (GLUCOPHAGE) 1000 MG tablet [Pharmacy Med Name: METFORMIN HCL 1,000 MG TABLET] 60 tablet 2    Sig: TAKE 1 TABLET (1,000 MG TOTAL) BY MOUTH 2 (TWO) TIMES DAILY WITH A MEAL.     Endocrinology:  Diabetes - Biguanides Failed - 01/17/2019  1:02 AM      Failed - Cr in normal range and within 360 days    Creatinine, Ser  Date Value Ref Range Status  01/10/2019 0.69 (L) 0.76 - 1.27 mg/dL Final         Passed - HBA1C is between 0 and 7.9 and within 180 days    Hgb A1c MFr Bld  Date Value Ref Range Status  01/10/2019 6.3 (H) 4.8 - 5.6 % Final    Comment:             Prediabetes: 5.7 - 6.4          Diabetes: >6.4          Glycemic control for adults with diabetes: <7.0  Passed - eGFR in normal range and within 360 days    GFR calc Af Amer  Date Value Ref Range Status  01/10/2019 134 >59 mL/min/1.73 Final   GFR calc non Af Amer  Date Value Ref Range Status  01/10/2019 116 >59 mL/min/1.73 Final         Passed - Valid encounter within last 6 months    Recent Outpatient Visits          1 week ago Type 2 diabetes mellitus with other specified complication, without long-term current use of insulin [facility])   [facility] [place], [place], PA-C   3 months ago Type 2 diabetes mellitus with other specified complication, without long-term current use of insulin [facility])   [facility] [place], [place], [place]   5 months ago [place], PA-C   6 months ago Hyperlipidemia associated with type 2 diabetes mellitus [facility])   [place], [place], PA-C   10 months ago Type 2 diabetes mellitus with other specified complication, without long-term current use of insulin [facility])   [facility] Trinna Post, [place]      Future Appointments            In 2 months Trinna Post, PA-C [employer], [facility]           . lisinopril-hydrochlorothiazide  (ZESTORETIC) 20-12.5 MG tablet [Pharmacy Med Name: LISINOPRIL-HCTZ 20-12.5 MG TAB] 30 tablet 2    Sig: TAKE 1 TABLET BY MOUTH EVERY DAY IN THE MORNING     Cardiovascular:  ACEI + Diuretic Combos Failed - 01/17/2019  1:02 AM      Failed - Cr in normal range and within 180 days    Creatinine, Ser  Date Value Ref Range Status  01/10/2019 0.69 (L) 0.76 - 1.27 mg/dL Final         Failed - Last BP in normal range    BP Readings from Last 1 Encounters:  01/10/19 (!) 135/94         Passed - Na in normal range and within 180 days    Sodium  Date Value Ref Range Status  01/10/2019 137 134 - 144 mmol/L Final         Passed - K in normal range and within 180 days    Potassium  Date Value Ref Range Status  01/10/2019 4.0 3.5 - 5.2 mmol/L Final         Passed - Ca in normal range and within 180 days    Calcium  Date Value Ref Range Status  01/10/2019 9.6 8.7 - 10.2 mg/dL Final         Passed - Patient is not pregnant      Passed - Valid encounter within last 6 months    Recent Outpatient Visits          1 week ago Type 2 diabetes mellitus with other specified complication, without long-term current use of insulin [facility])   [place], Adriana M, PA-C   3 months ago Type 2 diabetes mellitus with other specified complication, without long-term current use of insulin [facility])   [place], [place], [place]   5 months ago [place], [place], PA-C   6 months ago Hyperlipidemia associated with type 2 diabetes mellitus [facility])   [place], Fabio Bering M, [place]   10 months ago Type 2  diabetes mellitus with other specified complication, without long-term current use of insulin [facility])   [facility] Trinna Post, [place]      Future Appointments            In 2 months Terrilee Croak, Wendee Beavers, PA-C [employer], [place]

## 2019-04-10 ENCOUNTER — Other Ambulatory Visit: Payer: Self-pay

## 2019-04-10 ENCOUNTER — Ambulatory Visit (INDEPENDENT_AMBULATORY_CARE_PROVIDER_SITE_OTHER): Payer: 59 | Admitting: Physician Assistant

## 2019-04-10 ENCOUNTER — Encounter: Payer: Self-pay | Admitting: Physician Assistant

## 2019-04-10 VITALS — BP 151/91 | HR 79 | Temp 97.3°F | Wt 288.0 lb

## 2019-04-10 DIAGNOSIS — E1169 Type 2 diabetes mellitus with other specified complication: Secondary | ICD-10-CM

## 2019-04-10 DIAGNOSIS — F419 Anxiety disorder, unspecified: Secondary | ICD-10-CM

## 2019-04-10 DIAGNOSIS — Z6837 Body mass index (BMI) 37.0-37.9, adult: Secondary | ICD-10-CM

## 2019-04-10 DIAGNOSIS — E1159 Type 2 diabetes mellitus with other circulatory complications: Secondary | ICD-10-CM | POA: Diagnosis not present

## 2019-04-10 DIAGNOSIS — F329 Major depressive disorder, single episode, unspecified: Secondary | ICD-10-CM

## 2019-04-10 DIAGNOSIS — E785 Hyperlipidemia, unspecified: Secondary | ICD-10-CM

## 2019-04-10 DIAGNOSIS — I10 Essential (primary) hypertension: Secondary | ICD-10-CM

## 2019-04-10 MED ORDER — SERTRALINE HCL 100 MG PO TABS: 100.0000 mg | ORAL_TABLET | Freq: Every day | ORAL | 1 refills | Status: DC

## 2019-04-10 NOTE — Patient Instructions (Addendum)
GET YOUR EYE EXAM - PLEASE SCHEDULE WITH DR. Ellin Mayhew     Diabetes Mellitus and Exercise Exercising regularly is important for your overall health, especially when you have diabetes (diabetes mellitus). Exercising is not only about losing weight. It has many other health benefits, such as increasing muscle strength and bone density and reducing body fat and stress. This leads to improved fitness, flexibility, and endurance, all of which result in better overall health. Exercise has additional benefits for people with diabetes, including:  Reducing appetite.  Helping to lower and control blood glucose.  Lowering blood pressure.  Helping to control amounts of fatty substances (lipids) in the blood, such as cholesterol and triglycerides.  Helping the body to respond better to insulin (improving insulin sensitivity).  Reducing how much insulin the body needs.  Decreasing the risk for heart disease by: ? Lowering cholesterol and triglyceride levels. ? Increasing the levels of good cholesterol. ? Lowering blood glucose levels. What is my activity plan? Your health care provider or certified diabetes educator can help you make a plan for the type and frequency of exercise (activity plan) that works for you. Make sure that you:  Do at least 150 minutes of moderate-intensity or vigorous-intensity exercise each week. This could be brisk walking, biking, or water aerobics. ? Do stretching and strength exercises, such as yoga or weightlifting, at least 2 times a week. ? Spread out your activity over at least 3 days of the week.  Get some form of physical activity every day. ? Do not go more than 2 days in a row without some kind of physical activity. ? Avoid being inactive for more than 30 minutes at a time. Take frequent breaks to walk or stretch.  Choose a type of exercise or activity that you enjoy, and set realistic goals.  Start slowly, and gradually increase the intensity of your  exercise over time. What do I need to know about managing my diabetes?   Check your blood glucose before and after exercising. ? If your blood glucose is 240 mg/dL (13.3 mmol/L) or higher before you exercise, check your urine for ketones. If you have ketones in your urine, do not exercise until your blood glucose returns to normal. ? If your blood glucose is 100 mg/dL (5.6 mmol/L) or lower, eat a snack containing 15-20 grams of carbohydrate. Check your blood glucose 15 minutes after the snack to make sure that your level is above 100 mg/dL (5.6 mmol/L) before you start your exercise.  Know the symptoms of low blood glucose (hypoglycemia) and how to treat it. Your risk for hypoglycemia increases during and after exercise. Common symptoms of hypoglycemia can include: ? Hunger. ? Anxiety. ? Sweating and feeling clammy. ? Confusion. ? Dizziness or feeling light-headed. ? Increased heart rate or palpitations. ? Blurry vision. ? Tingling or numbness around the mouth, lips, or tongue. ? Tremors or shakes. ? Irritability.  Keep a rapid-acting carbohydrate snack available before, during, and after exercise to help prevent or treat hypoglycemia.  Avoid injecting insulin into areas of the body that are going to be exercised. For example, avoid injecting insulin into: ? The arms, when playing tennis. ? The legs, when jogging.  Keep records of your exercise habits. Doing this can help you and your health care provider adjust your diabetes management plan as needed. Write down: ? Food that you eat before and after you exercise. ? Blood glucose levels before and after you exercise. ? The type and amount of exercise  you have done. ? When your insulin is expected to peak, if you use insulin. Avoid exercising at times when your insulin is peaking.  When you start a new exercise or activity, work with your health care provider to make sure the activity is safe for you, and to adjust your insulin,  medicines, or food intake as needed.  Drink plenty of water while you exercise to prevent dehydration or heat stroke. Drink enough fluid to keep your urine clear or pale yellow. Summary  Exercising regularly is important for your overall health, especially when you have diabetes (diabetes mellitus).  Exercising has many health benefits, such as increasing muscle strength and bone density and reducing body fat and stress.  Your health care provider or certified diabetes educator can help you make a plan for the type and frequency of exercise (activity plan) that works for you.  When you start a new exercise or activity, work with your health care provider to make sure the activity is safe for you, and to adjust your insulin, medicines, or food intake as needed. This information is not intended to replace advice given to you by your health care provider. Make sure you discuss any questions you have with your health care provider. Document Revised: 08/12/2016 Document Reviewed: 06/30/2015 Elsevier Patient Education  2020 [employer].

## 2019-04-10 NOTE — Progress Notes (Signed)
Patient: Brent Compton Male    DOB: 06-04-1975   44 y.o.   MRN: [id number] Visit Date: 04/10/2019  Today's Provider: Trey Sailors, PA-C   Chief Complaint  Patient presents with  . Diabetes  . Hypertension  . Hyperlipidemia  . Anxiety   Subjective:     HPI  Diabetes Mellitus Type II, Follow-up:   Lab Results  Component Value Date   HGBA1C 6.3 (H) 01/10/2019   HGBA1C 6.2 (A) 10/11/2018   HGBA1C 6.4 (A) 07/10/2018   Last seen for diabetes 3 months ago.  Management since then includes none. He reports good compliance with treatment. He is not having side effects.  Current symptoms include none and have been stable. Home blood sugar records: not currently checking at home.  Episodes of hypoglycemia? no   Current Insulin Regimen: none Most Recent Eye Exam: none Weight trend: increasing steadily Prior visit with dietician: no Current diet: well balanced Current exercise: none  ------------------------------------------------------------------------   Lipid/Cholesterol, Follow-up:   Last seen for this 3 months ago.  Management since that visit includes none.  Last Lipid Panel:    Component Value Date/Time   CHOL 164 01/10/2019 1536   TRIG 456 (H) 01/10/2019 1536   HDL 48 01/10/2019 1536   CHOLHDL 3.4 01/10/2019 1536   LDLCALC 49 01/10/2019 1536   LDLDIRECT 76 11/29/2017 1456    He reports good compliance with treatment. He is not having side effects.   Wt Readings from Last 3 Encounters:  04/10/19 288 lb (130.6 kg)  01/10/19 281 lb 9.6 oz (127.7 kg)  10/11/18 274 lb (124.3 kg)   HTN: Reports he was inconsistent with lisinopril-HCTZ 10-12.5 mg daily due to error in picking it up. Otherwise takes it regulalry and Bps have been controlled.  BP Readings from Last 3 Encounters:  04/10/19 (!) 151/91  01/10/19 (!) 135/94  10/11/18 138/86     ------------------------------------------------------------------------   Anxiety Patient presents  today for anxiety follow-up. Patient last office visit was on 01/10/2019. Patient is currently taking Zoloft and reports good compliance with treatment.  GAD 7 : Generalized Anxiety Score 01/10/2019  Nervous, Anxious, on Edge 0  Control/stop worrying 0  Worry too much - different things 0  Trouble relaxing 0  Restless 0  Easily annoyed or irritable 0  Afraid - awful might happen 0  Total GAD 7 Score 0  Anxiety Difficulty Not difficult at all     No Known Allergies   Current Outpatient Medications:  .  atorvastatin (LIPITOR) 10 MG tablet, TAKE 1 TABLET BY MOUTH EVERY DAY, Disp: 30 tablet, Rfl: 2 .  lisinopril-hydrochlorothiazide (ZESTORETIC) 20-12.5 MG tablet, TAKE 1 TABLET BY MOUTH EVERY DAY IN THE MORNING, Disp: 30 tablet, Rfl: 2 .  metFORMIN (GLUCOPHAGE) 1000 MG tablet, TAKE 1 TABLET (1,000 MG TOTAL) BY MOUTH 2 (TWO) TIMES DAILY WITH A MEAL., Disp: 60 tablet, Rfl: 2 .  sertraline (ZOLOFT) 100 MG tablet, Take 1 tablet (100 mg total) by mouth daily., Disp: 90 tablet, Rfl: 1  Review of Systems  Social History   Tobacco Use  . Smoking status: Never Smoker  . Smokeless tobacco: Never Used  Substance Use Topics  . Alcohol use: Yes    Comment: OCC      Objective:   BP (!) 151/91 (BP Location: Left Arm, Patient Position: Sitting, Cuff Size: Large)   Pulse 79   Temp (!) 97.3 F (36.3 C) (Temporal)   Wt 288 lb (130.6 kg)  BMI 40.17 kg/m  Vitals:   04/10/19 1602  BP: (!) 151/91  Pulse: 79  Temp: (!) 97.3 F (36.3 C)  TempSrc: Temporal  Weight: 288 lb (130.6 kg)  Body mass index is 40.17 kg/m.   Physical Exam Constitutional:      Appearance: Normal appearance.  Cardiovascular:     Rate and Rhythm: Normal rate and regular rhythm.     Heart sounds: Normal heart sounds.  Pulmonary:     Effort: Pulmonary effort is normal.     Breath sounds: Normal breath sounds.  Skin:    General: Skin is warm and dry.  Neurological:     Mental Status: He is alert and oriented to  person, place, and time. Mental status is at baseline.  Psychiatric:        Mood and Affect: Mood normal.        Behavior: Behavior normal.      No results found for any visits on 04/10/19.     Assessment & Plan    1. Type 2 diabetes mellitus with other specified complication, without long-term current use of insulin (HCC)  Absolutely needs to get an eye exam for diabetes. I have reminded him of this numerous times and I have written it down on his AVS. Follow up in 6 months. Continue medications.   2. Hyperlipidemia associated with type 2 diabetes mellitus ([place])  Continue Lipitor.   3. Anxiety and depression  - sertraline (ZOLOFT) 100 MG tablet; Take 1 tablet (100 mg total) by mouth daily.  Dispense: 90 tablet; Refill: 1  4. Class 2 severe obesity due to excess calories with serious comorbidity and body mass index (BMI) of 37.0 to 37.9 in adult ([place])   5. Hypertension associated with diabetes ([place])  Continue medications.   The entirety of the information documented in the History of Present Illness, Review of Systems and Physical Exam were personally obtained by me. Portions of this information were initially documented by [facility] and reviewed by me for thoroughness and accuracy.   F/u 6 months       Trinna Post, PA-C  [facility]

## 2019-05-14 ENCOUNTER — Other Ambulatory Visit: Payer: Self-pay | Admitting: Physician Assistant

## 2019-05-14 DIAGNOSIS — I10 Essential (primary) hypertension: Secondary | ICD-10-CM

## 2019-05-14 DIAGNOSIS — E1169 Type 2 diabetes mellitus with other specified complication: Secondary | ICD-10-CM

## 2019-06-12 ENCOUNTER — Other Ambulatory Visit: Payer: Self-pay | Admitting: Physician Assistant

## 2019-06-12 NOTE — Telephone Encounter (Signed)
Requested medication (s) are due for refill today:yes  Requested medication (s) are on the active medication list: no  Last refill: 01/11/18  Future visit scheduled: yes  Notes to clinic:  Not on medication list. Last refill 01/11/2018    Requested Prescriptions  Pending Prescriptions Disp Refills   ACCU-CHEK GUIDE test strip [Pharmacy Med Name: ACCU-CHEK GUIDE TEST STRIP] 50 strip     Sig: USE TO CHECK SUGAR ONCE IN THE MORNING AS DIRECTED      Endocrinology: Diabetes - Testing Supplies Passed - 06/12/2019  5:16 PM      Passed - Valid encounter within last 12 months    Recent Outpatient Visits           2 months ago Type 2 diabetes mellitus with other specified complication, without long-term current use of insulin [facility])   [facility] [place], Adriana M, PA-C   5 months ago Type 2 diabetes mellitus with other specified complication, without long-term current use of insulin [facility])   [facility] Melia, [place], PA-C   8 months ago Type 2 diabetes mellitus with other specified complication, without long-term current use of insulin [facility])   [facility] [place], [place], [place]   10 months ago Anxiety   [facility] Osvaldo Angst M, [place]   11 months ago Hyperlipidemia associated with type 2 diabetes mellitus [facility])   [facility] Trey Sailors, [place]       Future Appointments             In 4 months Jodi Marble, Lavella Hammock, PA-C [facility] [employer] [facility]

## 2019-07-27 ENCOUNTER — Encounter: Payer: Self-pay | Admitting: Physician Assistant

## 2019-07-27 ENCOUNTER — Other Ambulatory Visit: Payer: Self-pay

## 2019-07-27 ENCOUNTER — Ambulatory Visit (INDEPENDENT_AMBULATORY_CARE_PROVIDER_SITE_OTHER): Payer: 59 | Admitting: Physician Assistant

## 2019-07-27 VITALS — BP 137/90 | HR 88 | Temp 98.1°F | Resp 20 | Ht 71.0 in | Wt 285.0 lb

## 2019-07-27 DIAGNOSIS — R300 Dysuria: Secondary | ICD-10-CM

## 2019-07-27 LAB — POCT URINALYSIS DIPSTICK
Bilirubin, UA: NEGATIVE
Glucose, UA: NEGATIVE
Ketones, UA: NEGATIVE
Nitrite, UA: NEGATIVE
Protein, UA: NEGATIVE
Spec Grav, UA: 1.025 (ref 1.010–1.025)
Urobilinogen, UA: 0.2 E.U./dL
pH, UA: 6 (ref 5.0–8.0)

## 2019-07-27 MED ORDER — SULFAMETHOXAZOLE-TRIMETHOPRIM 800-160 MG PO TABS: 1.0000 | ORAL_TABLET | Freq: Two times a day (BID) | ORAL | 0 refills | Status: AC

## 2019-07-27 NOTE — Progress Notes (Signed)
Established patient visit   Patient: Brent Compton   DOB: [birth year]   44 y.o. Male  MRN: [id number] Visit Date: 07/27/2019  Today's healthcare provider: Trinna Post, PA-C   Chief Complaint  Patient presents with  . Dysuria   Subjective    HPI Urinary symptoms  He reports new onset dysuria. The current episode started a few months ago and is gradually worsening. Patient states symptoms are moderate in intensity, occurring constantly. He  has not been recently treated for similar symptoms. He has had UTI symptoms in the past. No new sexual partners. Denies rectal pain. He also mentions hesitancy while urinating.     Associated symptoms: No abdominal pain No back pain  No chills No constipation  No cramping No diarrhea  No discharge No fever  No hematuria No nausea  No vomiting         Medications: Outpatient Medications Prior to Visit  Medication Sig  . ACCU-CHEK GUIDE test strip USE TO CHECK SUGAR ONCE IN THE MORNING AS DIRECTED  . atorvastatin (LIPITOR) 10 MG tablet TAKE 1 TABLET BY MOUTH EVERY DAY  . lisinopril-hydrochlorothiazide (ZESTORETIC) 20-12.5 MG tablet TAKE 1 TABLET BY MOUTH EVERY DAY IN THE MORNING  . metFORMIN (GLUCOPHAGE) 1000 MG tablet TAKE 1 TABLET (1,000 MG TOTAL) BY MOUTH 2 (TWO) TIMES DAILY WITH A MEAL.  Marland Kitchen sertraline (ZOLOFT) 100 MG tablet Take 1 tablet (100 mg total) by mouth daily.   No facility-administered medications prior to visit.    Review of Systems  Gastrointestinal: Negative for abdominal distention, abdominal pain, anal bleeding, blood in stool, constipation, diarrhea, nausea, rectal pain and vomiting.  Genitourinary: Positive for decreased urine volume, difficulty urinating, dysuria and frequency. Negative for discharge, flank pain, hematuria, penile pain, penile swelling, scrotal swelling, testicular pain and urgency.  Musculoskeletal: Negative for myalgias.      Objective    BP 137/90 (BP Location: Right Arm)   Pulse  88   Temp 98.1 F (36.7 C)   Resp 20   Ht 5\' 11"  (1.803 m)   Wt 285 lb (129.3 kg)   BMI 39.75 kg/m    Physical Exam Constitutional:      Appearance: Normal appearance.  Cardiovascular:     Rate and Rhythm: Normal rate and regular rhythm.     Pulses: Normal pulses.     Heart sounds: Normal heart sounds.  Pulmonary:     Effort: Pulmonary effort is normal.     Breath sounds: Normal breath sounds.  Skin:    General: Skin is warm and dry.  Neurological:     Mental Status: He is alert and oriented to person, place, and time. Mental status is at baseline.  Psychiatric:        Mood and Affect: Mood normal.        Behavior: Behavior normal.     Results for orders placed or performed in visit on 07/27/19  Urine Culture   Specimen: Urine   PE  Result Value Ref Range   Urine Culture, Routine Final report    Organism ID, Bacteria Comment   Microscopic Examination  Result Value Ref Range   WBC, UA 11-30 (A) 0 - 5 /hpf   RBC None seen 0 - 2 /hpf   Epithelial Cells (non renal) 0-10 0 - 10 /hpf   Casts None seen None seen /lpf   Bacteria, UA None seen None seen/Few  Urinalysis, Routine w reflex microscopic  Result Value Ref Range  Specific Gravity, UA 1.022 1.005 - 1.030   pH, UA 5.5 5.0 - 7.5   Color, UA Yellow Yellow   Appearance Ur Clear Clear   Leukocytes,UA 2+ (A) Negative   Protein,UA Negative Negative/Trace   Glucose, UA Negative Negative   Ketones, UA Negative Negative   RBC, UA Negative Negative   Bilirubin, UA Negative Negative   Urobilinogen, Ur 0.2 0.2 - 1.0 mg/dL   Nitrite, UA Negative Negative   Microscopic Examination See below:   PSA  Result Value Ref Range   Prostate Specific Ag, Serum 1.0 0.0 - 4.0 ng/mL  Comprehensive metabolic panel  Result Value Ref Range   Glucose 108 (H) 65 - 99 mg/dL   BUN 15 6 - 24 mg/dL   Creatinine, Ser 0.62 (L) 0.76 - 1.27 mg/dL   GFR calc non Af Amer 117 >59 mL/min/1.73   GFR calc Af Amer 135 >59 mL/min/1.73    BUN/Creatinine Ratio 22 (H) 9 - 20   Sodium 135 134 - 144 mmol/L   Potassium 4.1 3.5 - 5.2 mmol/L   Chloride 96 96 - 106 mmol/L   CO2 21 20 - 29 mmol/L   Calcium 9.7 8.7 - 10.2 mg/dL   Total Protein 6.9 6.0 - 8.5 g/dL   Albumin 4.4 4.0 - 5.0 g/dL   Globulin, Total 2.5 1.5 - 4.5 g/dL   Albumin/Globulin Ratio 1.8 1.2 - 2.2   Bilirubin Total 0.3 0.0 - 1.2 mg/dL   Alkaline Phosphatase 59 48 - 121 IU/L   AST 21 0 - 40 IU/L   ALT 40 0 - 44 IU/L  POCT urinalysis dipstick  Result Value Ref Range   Color, UA yellow    Clarity, UA clear    Glucose, UA Negative Negative   Bilirubin, UA negative    Ketones, UA negative    Spec Grav, UA 1.025 1.010 - 1.025   Blood, UA hemolyzed small    pH, UA 6.0 5.0 - 8.0   Protein, UA Negative Negative   Urobilinogen, UA 0.2 0.2 or 1.0 E.U./dL   Nitrite, UA negative    Leukocytes, UA Small (1+) (A) Negative    Assessment & Plan    1. Dysuria Treat for possible UTI. He declines a rectal exam. Counseled that his symptoms could be related to prostatic hypertrophy. Smaller chance of malignancy. Will get PSA and consider trial of Flomax.  - Urine Culture - Urinalysis, Routine w reflex microscopic - PSA - sulfamethoxazole-trimethoprim (BACTRIM DS) 800-160 MG tablet; Take 1 tablet by mouth 2 (two) times daily for 10 days.  Dispense: 20 tablet; Refill: 0 - Comprehensive metabolic panel - POCT urinalysis dipstick   Return if symptoms worsen or fail to improve.      ITrey Sailors, PA-C, have reviewed all documentation for this visit. The documentation on 07/31/19 for the exam, diagnosis, procedures, and orders are all accurate and complete.    Maryella Shivers  [facility] [phone] (phone) [phone] (fax)  [facility]

## 2019-07-28 LAB — COMPREHENSIVE METABOLIC PANEL
ALT: 40 IU/L (ref 0–44)
AST: 21 IU/L (ref 0–40)
Albumin/Globulin Ratio: 1.8 (ref 1.2–2.2)
Albumin: 4.4 g/dL (ref 4.0–5.0)
Alkaline Phosphatase: 59 IU/L (ref 48–121)
BUN/Creatinine Ratio: 22 — ABNORMAL HIGH (ref 9–20)
BUN: 15 mg/dL (ref 6–24)
Bilirubin Total: 0.3 mg/dL (ref 0.0–1.2)
CO2: 21 mmol/L (ref 20–29)
Calcium: 9.7 mg/dL (ref 8.7–10.2)
Chloride: 96 mmol/L (ref 96–106)
Creatinine, Ser: 0.68 mg/dL — ABNORMAL LOW (ref 0.76–1.27)
GFR calc Af Amer: 135 mL/min/{1.73_m2} (ref >59)
GFR calc non Af Amer: 117 mL/min/{1.73_m2} (ref >59)
Globulin, Total: 2.5 g/dL (ref 1.5–4.5)
Glucose: 108 mg/dL — ABNORMAL HIGH (ref 65–99)
Potassium: 4.1 mmol/L (ref 3.5–5.2)
Sodium: 135 mmol/L (ref 134–144)
Total Protein: 6.9 g/dL (ref 6.0–8.5)

## 2019-07-28 LAB — URINALYSIS, ROUTINE W REFLEX MICROSCOPIC
Bilirubin, UA: NEGATIVE
Glucose, UA: NEGATIVE
Ketones, UA: NEGATIVE
Nitrite, UA: NEGATIVE
Protein,UA: NEGATIVE
RBC, UA: NEGATIVE
Specific Gravity, UA: 1.022 (ref 1.005–1.030)
Urobilinogen, Ur: 0.2 mg/dL (ref 0.2–1.0)
pH, UA: 5.5 (ref 5.0–7.5)

## 2019-07-28 LAB — MICROSCOPIC EXAMINATION
Bacteria, UA: NONE SEEN
Casts: NONE SEEN /lpf
RBC, Urine: NONE SEEN /hpf (ref 0–2)

## 2019-07-28 LAB — PSA: Prostate Specific Ag, Serum: 1 ng/mL (ref 0.0–4.0)

## 2019-07-29 LAB — URINE CULTURE

## 2019-07-30 ENCOUNTER — Telehealth: Payer: Self-pay

## 2019-07-30 NOTE — Telephone Encounter (Signed)
-----   Message from Trey Sailors, [place] sent at 07/30/2019  1:09 PM EDT ----- His urine culture did not grow out a bacteria. His PSA or prostate test was within normal range. Remaining labs are stable. I think it would be best for him to complete the trial of antibiotics and follow up if not getting better.

## 2019-07-30 NOTE — Telephone Encounter (Signed)
Patient has been advised. KW 

## 2019-08-08 NOTE — Progress Notes (Signed)
Established patient visit   Patient: Brent Compton   DOB: 12/30/1975   44 y.o. Male  MRN: [id number] Visit Date: 08/09/2019  Today's healthcare provider: Trey Sailors, PA-C   Chief Complaint  Patient presents with  . Follow-up  I,Sullivan Jacuinde M Ahava Kissoon,acting as a scribe for Trey Sailors, PA-C.,have documented all relevant documentation on the behalf of Trey Sailors, PA-C,as directed by  Trey Sailors, PA-C while in the presence of Trey Sailors, PA-C.  Subjective    HPI  Follow up for Dysuria.   The patient was last seen for this 2 weeks ago. Changes made at last visit include starting Bactrim DS. All other lab tests were WNL.   He reports poor compliance with treatment. He feels that condition is Unchanged.Patient reports that Bactrim did not help clear his symptoms and he reports the burning has gotten worse. Denies rectal pain, back pain, Nausea, vomiting.          Medications: Outpatient Medications Prior to Visit  Medication Sig  . ACCU-CHEK GUIDE test strip USE TO CHECK SUGAR ONCE IN THE MORNING AS DIRECTED  . atorvastatin (LIPITOR) 10 MG tablet TAKE 1 TABLET BY MOUTH EVERY DAY  . lisinopril-hydrochlorothiazide (ZESTORETIC) 20-12.5 MG tablet TAKE 1 TABLET BY MOUTH EVERY DAY IN THE MORNING  . metFORMIN (GLUCOPHAGE) 1000 MG tablet TAKE 1 TABLET (1,000 MG TOTAL) BY MOUTH 2 (TWO) TIMES DAILY WITH A MEAL.  Marland Kitchen sertraline (ZOLOFT) 100 MG tablet Take 1 tablet (100 mg total) by mouth daily.   No facility-administered medications prior to visit.    Review of Systems  Constitutional: Negative.   Respiratory: Negative.   Gastrointestinal: Positive for abdominal pain.  Genitourinary: Positive for dysuria and penile pain. Negative for difficulty urinating, discharge, frequency, penile swelling, testicular pain and urgency.  Musculoskeletal: Negative for back pain.      Objective    BP (!) 152/102 (BP Location: Left Arm, Patient Position: Sitting, Cuff  Size: Normal)   Pulse 77   Temp (!) 96.9 F (36.1 C) (Temporal)   Wt 287 lb (130.2 kg)   SpO2 99%   BMI 40.03 kg/m    Physical Exam Constitutional:      Appearance: Normal appearance.  Cardiovascular:     Rate and Rhythm: Normal rate and regular rhythm.     Heart sounds: Normal heart sounds.  Pulmonary:     Effort: Pulmonary effort is normal.  Skin:    General: Skin is warm and dry.  Neurological:     Mental Status: He is alert and oriented to person, place, and time. Mental status is at baseline.  Psychiatric:        Mood and Affect: Mood normal.        Behavior: Behavior normal.       Results for orders placed or performed in visit on 08/09/19  POCT urinalysis dipstick  Result Value Ref Range   Color, UA Dark Yellow    Clarity, UA Clear    Glucose, UA Negative Negative   Bilirubin, UA Negative    Ketones, UA Negative    Spec Grav, UA 1.015 1.010 - 1.025   Blood, UA Moderate    pH, UA 6.0 5.0 - 8.0   Protein, UA Negative Negative   Urobilinogen, UA 0.2 0.2 or 1.0 E.U./dL   Nitrite, UA Negative    Leukocytes, UA Moderate (2+) (A) Negative   Appearance     Odor      Assessment &  Plan    1. Dysuria  Check for STI. Cipro to cover for prostatitis. Referral to urology. - POCT urinalysis dipstick - Ambulatory referral to Urology - ciprofloxacin (CIPRO) 500 MG tablet; Take 1 tablet (500 mg total) by mouth 2 (two) times daily.  Dispense: 20 tablet; Refill: 0 - Urine cytology ancillary only  2. Prostate infection  Treat empirically. But will refer to urology for further workup.   - ciprofloxacin (CIPRO) 500 MG tablet; Take 1 tablet (500 mg total) by mouth 2 (two) times daily.  Dispense: 20 tablet; Refill: 0 - Urine cytology ancillary only    Return if symptoms worsen or fail to improve.      ITrey Sailors, PA-C, have reviewed all documentation for this visit. The documentation on 08/10/19 for the exam, diagnosis, procedures, and orders are all  accurate and complete.    Maryella Shivers  [facility] [phone] (phone) [phone] (fax)  [facility]

## 2019-08-09 ENCOUNTER — Ambulatory Visit (INDEPENDENT_AMBULATORY_CARE_PROVIDER_SITE_OTHER): Payer: 59 | Admitting: Physician Assistant

## 2019-08-09 ENCOUNTER — Encounter: Payer: Self-pay | Admitting: Physician Assistant

## 2019-08-09 ENCOUNTER — Other Ambulatory Visit (HOSPITAL_COMMUNITY)
Admission: RE | Admit: 2019-08-09 | Discharge: 2019-08-09 | Disposition: A | Discharge status: Home / Self Care | Payer: No Typology Code available for payment source | Source: Ambulatory Visit | Attending: Physician Assistant | Admitting: Physician Assistant

## 2019-08-09 ENCOUNTER — Other Ambulatory Visit: Payer: Self-pay

## 2019-08-09 VITALS — BP 152/102 | HR 77 | Temp 96.9°F | Wt 287.0 lb

## 2019-08-09 DIAGNOSIS — A599 Trichomoniasis, unspecified: Secondary | ICD-10-CM

## 2019-08-09 DIAGNOSIS — N419 Inflammatory disease of prostate, unspecified: Secondary | ICD-10-CM | POA: Insufficient documentation

## 2019-08-09 DIAGNOSIS — R300 Dysuria: Secondary | ICD-10-CM | POA: Diagnosis not present

## 2019-08-09 LAB — POCT URINALYSIS DIPSTICK
Bilirubin, UA: NEGATIVE
Glucose, UA: NEGATIVE
Ketones, UA: NEGATIVE
Nitrite, UA: NEGATIVE
Protein, UA: NEGATIVE
Spec Grav, UA: 1.015 (ref 1.010–1.025)
Urobilinogen, UA: 0.2 E.U./dL
pH, UA: 6 (ref 5.0–8.0)

## 2019-08-09 MED ORDER — CIPROFLOXACIN HCL 500 MG PO TABS
500.0000 mg | ORAL_TABLET | Freq: Two times a day (BID) | ORAL | 0 refills | Status: DC
Start: 2019-08-09 — End: 2019-08-10

## 2019-08-10 LAB — URINE CYTOLOGY ANCILLARY ONLY
Chlamydia: NEGATIVE
Comment: NEGATIVE
Comment: NEGATIVE
Comment: NORMAL
Neisseria Gonorrhea: NEGATIVE
Trichomonas: POSITIVE — AB

## 2019-08-10 MED ORDER — METRONIDAZOLE 500 MG PO TABS: 500.0000 mg | ORAL_TABLET | Freq: Two times a day (BID) | ORAL | 0 refills | Status: AC

## 2019-08-10 NOTE — Addendum Note (Signed)
Addended by: Trey Sailors on: 08/10/2019 04:00 PM   Modules accepted: Orders

## 2019-08-22 ENCOUNTER — Ambulatory Visit: Payer: 59 | Admitting: Urology

## 2019-08-26 ENCOUNTER — Other Ambulatory Visit: Payer: Self-pay | Admitting: Physician Assistant

## 2019-08-26 DIAGNOSIS — E1169 Type 2 diabetes mellitus with other specified complication: Secondary | ICD-10-CM

## 2019-08-26 DIAGNOSIS — I10 Essential (primary) hypertension: Secondary | ICD-10-CM

## 2019-08-26 DIAGNOSIS — E785 Hyperlipidemia, unspecified: Secondary | ICD-10-CM

## 2019-08-26 NOTE — Telephone Encounter (Signed)
Requested Prescriptions  Pending Prescriptions Disp Refills  . metFORMIN (GLUCOPHAGE) 1000 MG tablet [Pharmacy Med Name: METFORMIN HCL 1,000 MG TABLET] 60 tablet     Sig: TAKE 1 TABLET (1,000 MG TOTAL) BY MOUTH 2 (TWO) TIMES DAILY WITH A MEAL.     Endocrinology:  Diabetes - Biguanides Failed - 08/26/2019  9:30 AM      Failed - Cr in normal range and within 360 days    Creatinine, Ser  Date Value Ref Range Status  07/27/2019 0.68 (L) 0.76 - 1.27 mg/dL Final         Failed - HBA1C is between 0 and 7.9 and within 180 days    Hgb A1c MFr Bld  Date Value Ref Range Status  01/10/2019 6.3 (H) 4.8 - 5.6 % Final    Comment:             Prediabetes: 5.7 - 6.4          Diabetes: >6.4          Glycemic control for adults with diabetes: <7.0          Passed - eGFR in normal range and within 360 days    GFR calc Af Amer  Date Value Ref Range Status  07/27/2019 135 >59 mL/min/1.73 Final    Comment:    **[facility] currently reports eGFR in compliance with the current**   recommendations of the [employer]. [facility] will   update reporting as new guidelines are published from the NKF-ASN   Task force.    GFR calc non Af Amer  Date Value Ref Range Status  07/27/2019 117 >59 mL/min/1.73 Final         Passed - Valid encounter within last 6 months    Recent Outpatient Visits          2 weeks ago [place], [place], [place]   1 month ago [place], [place], [place]   4 months ago Type 2 diabetes mellitus with other specified complication, without long-term current use of insulin [facility])   [place], [place], [place]   7 months ago Type 2 diabetes mellitus with other specified complication, without long-term current use of insulin [facility])   [place], [place], PA-C   10 months ago Type 2 diabetes mellitus with other specified complication, without long-term current use  of insulin [facility])   [facility] Trinna Post, [place]      Future Appointments            In 1 month Trinna Post, PA-C [employer], [facility]           . lisinopril-hydrochlorothiazide (ZESTORETIC) 20-12.5 MG tablet [Pharmacy Med Name: LISINOPRIL-HCTZ 20-12.5 MG TAB] 90 tablet 0    Sig: TAKE 1 TABLET BY MOUTH EVERY DAY IN THE MORNING     Cardiovascular:  ACEI + Diuretic Combos Failed - 08/26/2019  9:30 AM      Failed - Cr in normal range and within 180 days    Creatinine, Ser  Date Value Ref Range Status  07/27/2019 0.68 (L) 0.76 - 1.27 mg/dL Final         Failed - Last BP in normal range    BP Readings from Last 1 Encounters:  08/09/19 (!) 152/102         Passed - Na in normal range and within 180 days    Sodium  Date Value Ref Range Status  07/27/2019 135 134 - 144 mmol/L Final         Passed - K in normal range and within 180 days    Potassium  Date Value Ref Range Status  07/27/2019 4.1 3.5 - 5.2 mmol/L Final         Passed - Ca in normal range and within 180 days    Calcium  Date Value Ref Range Status  07/27/2019 9.7 8.7 - 10.2 mg/dL Final         Passed - Patient is not pregnant      Passed - Valid encounter within last 6 months    Recent Outpatient Visits          2 weeks ago Dysuria   [facility] Pollak, Adriana M, PA-C   1 month ago Dysuria   [facility] Pollak, Adriana M, PA-C   4 months ago Type 2 diabetes mellitus with other specified complication, without long-term current use of insulin (HCC)   [facility] Pollak, Adriana M, PA-C   7 months ago Type 2 diabetes mellitus with other specified complication, without long-term current use of insulin (HCC)   [facility] Pollak, Adriana M, PA-C   10 months ago Type 2 diabetes mellitus with other specified complication, without long-term current use of insulin (HCC)   [facility] Pollak, Adriana M,  PA-C      Future Appointments            In 1 month Pollak, Adriana M, PA-C [facility], [facility]           . atorvastatin (LIPITOR) 10 MG tablet [Pharmacy Med Name: ATORVASTATIN 10 MG TABLET] 90 tablet 1    Sig: TAKE 1 TABLET BY MOUTH EVERY DAY     Cardiovascular:  Antilipid - Statins Failed - 08/26/2019  9:30 AM      Failed - LDL in normal range and within 360 days    LDL Chol Calc (NIH)  Date Value Ref Range Status  01/10/2019 49 0 - 99 mg/dL Final   LDL Direct  Date Value Ref Range Status  11/29/2017 76 0 - 99 mg/dL Final         Failed - Triglycerides in normal range and within 360 days    Triglycerides  Date Value Ref Range Status  01/10/2019 456 (H) 0 - 149 mg/dL Final         Passed - Total Cholesterol in normal range and within 360 days    Cholesterol, Total  Date Value Ref Range Status  01/10/2019 164 100 - 199 mg/dL Final         Passed - HDL in normal range and within 360 days    HDL  Date Value Ref Range Status  01/10/2019 48 >39 mg/dL Final         Passed - Patient is not pregnant      Passed - Valid encounter within last 12 months    Recent Outpatient Visits          2 weeks ago Dysuria   [facility] Pollak, Adriana M, PA-C   1 month ago Dysuria   [facility] Pollak, Adriana M, PA-C   4 months ago Type 2 diabetes mellitus with other specified complication, without long-term current use of insulin (HCC)   [facility] Pollak, Adriana M, PA-C   7 months ago Type 2 diabetes mellitus with other specified complication, without   long-term current use of insulin [facility])   [place], [place], [place]   10 months ago Type 2 diabetes mellitus with other specified complication, without long-term current use of insulin [facility])   [facility] Trinna Post, [place]      Future Appointments            In 1 month Terrilee Croak, Wendee Beavers, PA-C [employer], [facility]

## 2019-08-26 NOTE — Telephone Encounter (Signed)
Requested medication (s) are due for refill today: yes  Requested medication (s) are on the active medication list: yes  Last refill:  05/14/19  Future visit scheduled: yes  Notes to clinic:  prescription expired 08/12/19   Requested Prescriptions  Pending Prescriptions Disp Refills   metFORMIN (GLUCOPHAGE) 1000 MG tablet [Pharmacy Med Name: METFORMIN HCL 1,000 MG TABLET] 60 tablet     Sig: TAKE 1 TABLET (1,000 MG TOTAL) BY MOUTH 2 (TWO) TIMES DAILY WITH A MEAL.      Endocrinology:  Diabetes - Biguanides Failed - 08/26/2019  9:30 AM      Failed - Cr in normal range and within 360 days    Creatinine, Ser  Date Value Ref Range Status  07/27/2019 0.68 (L) 0.76 - 1.27 mg/dL Final          Failed - HBA1C is between 0 and 7.9 and within 180 days    Hgb A1c MFr Bld  Date Value Ref Range Status  01/10/2019 6.3 (H) 4.8 - 5.6 % Final    Comment:             Prediabetes: 5.7 - 6.4          Diabetes: >6.4          Glycemic control for adults with diabetes: <7.0           Passed - eGFR in normal range and within 360 days    GFR calc Af Amer  Date Value Ref Range Status  07/27/2019 135 >59 mL/min/1.73 Final    Comment:    **[facility] currently reports eGFR in compliance with the current**   recommendations of the [employer]. [facility] will   update reporting as new guidelines are published from the NKF-ASN   Task force.    GFR calc non Af Amer  Date Value Ref Range Status  07/27/2019 117 >59 mL/min/1.73 Final          Passed - Valid encounter within last 6 months    Recent Outpatient Visits           2 weeks ago [place], [place], [place]   1 month ago [place], [place], [place]   4 months ago Type 2 diabetes mellitus with other specified complication, without long-term current use of insulin [facility])   [place], [place], PA-C   7 months ago Type 2 diabetes mellitus  with other specified complication, without long-term current use of insulin [facility])   [place], [place], PA-C   10 months ago Type 2 diabetes mellitus with other specified complication, without long-term current use of insulin [facility])   [facility] [place], [place], [place]       Future Appointments             In 1 month Pollak, Adriana M, PA-C [employer], [facility]             Signed Prescriptions Disp Refills   lisinopril-hydrochlorothiazide (ZESTORETIC) 20-12.5 MG tablet 90 tablet 0    Sig: TAKE 1 TABLET BY MOUTH EVERY DAY IN THE MORNING      Cardiovascular:  ACEI + Diuretic Combos Failed - 08/26/2019  9:30 AM      Failed - Cr in normal range and within 180 days    Creatinine, Ser  Date Value Ref Range Status  07/27/2019 0.68 (L) 0.76 - 1.27 mg/dL Final  Failed - Last BP in normal range    BP Readings from Last 1 Encounters:  08/09/19 (!) 152/102          Passed - Na in normal range and within 180 days    Sodium  Date Value Ref Range Status  07/27/2019 135 134 - 144 mmol/L Final          Passed - K in normal range and within 180 days    Potassium  Date Value Ref Range Status  07/27/2019 4.1 3.5 - 5.2 mmol/L Final          Passed - Ca in normal range and within 180 days    Calcium  Date Value Ref Range Status  07/27/2019 9.7 8.7 - 10.2 mg/dL Final          Passed - Patient is not pregnant      Passed - Valid encounter within last 6 months    Recent Outpatient Visits           2 weeks ago [place], Adriana M, PA-C   1 month ago [place], [place], PA-C   4 months ago Type 2 diabetes mellitus with other specified complication, without long-term current use of insulin [facility])   [place], [place], PA-C   7 months ago Type 2 diabetes mellitus with other specified complication, without long-term current  use of insulin [facility])   [place], [place], PA-C   10 months ago Type 2 diabetes mellitus with other specified complication, without long-term current use of insulin [facility])   [facility] [place], [place], [place]       Future Appointments             In 1 month Pollak, Adriana M, PA-C [place], [facility]              atorvastatin (LIPITOR) 10 MG tablet 90 tablet 1    Sig: TAKE 1 TABLET BY MOUTH EVERY DAY      Cardiovascular:  Antilipid - Statins Failed - 08/26/2019  9:30 AM      Failed - LDL in normal range and within 360 days    LDL Chol Calc (NIH)  Date Value Ref Range Status  01/10/2019 49 0 - 99 mg/dL Final   LDL Direct  Date Value Ref Range Status  11/29/2017 76 0 - 99 mg/dL Final          Failed - Triglycerides in normal range and within 360 days    Triglycerides  Date Value Ref Range Status  01/10/2019 456 (H) 0 - 149 mg/dL Final          Passed - Total Cholesterol in normal range and within 360 days    Cholesterol, Total  Date Value Ref Range Status  01/10/2019 164 100 - 199 mg/dL Final          Passed - HDL in normal range and within 360 days    HDL  Date Value Ref Range Status  01/10/2019 48 >39 mg/dL Final          Passed - Patient is not pregnant      Passed - Valid encounter within last 12 months    Recent Outpatient Visits           2 weeks ago [place], [place], PA-C   1 month ago Dysuria  [place], [place], PA-C   4 months ago Type 2 diabetes mellitus with other specified complication, without long-term current use of insulin [facility])   [place], [place], [place]   7 months ago Type 2 diabetes mellitus with other specified complication, without long-term current use of insulin [facility])   [place], [place], [place]   10 months ago Type 2 diabetes mellitus with other specified  complication, without long-term current use of insulin [facility])   [facility] Trinna Post, [place]       Future Appointments             In 1 month Terrilee Croak, Wendee Beavers, PA-C [employer], [facility]

## 2019-10-10 NOTE — Progress Notes (Signed)
Established patient visit   Patient: Brent Compton   DOB: [birth year]   44 y.o. Male  MRN: [id number] Visit Date: 10/11/2019  Today's healthcare provider: Trey Sailors, PA-C   Chief Complaint  Patient presents with  . Diabetes  . Hyperlipidemia  . Hypertension   Subjective    HPI  Diabetes Mellitus Type II, Follow-up  Lab Results  Component Value Date   HGBA1C 7.6 (A) 10/11/2019   HGBA1C 6.3 (H) 01/10/2019   HGBA1C 6.2 (A) 10/11/2018   Wt Readings from Last 3 Encounters:  10/11/19 289 lb 9.6 oz (131.4 kg)  08/09/19 287 lb (130.2 kg)  07/27/19 285 lb (129.3 kg)   Last seen for diabetes 6 months ago.  Management since then includes no changes. He reports good compliance with treatment. He is not having side effects.  Symptoms: No fatigue No foot ulcerations  No appetite changes No nausea  No paresthesia of the feet  No polydipsia  No polyuria No visual disturbances   No vomiting     Home blood sugar records: not being checked  Episodes of hypoglycemia? No    Current insulin regiment: none Most Recent Eye Exam: Not up to date, never gotten  Current exercise: no regular exercise Current diet habits: well balanced  Pertinent Labs: Lab Results  Component Value Date   CHOL 164 01/10/2019   HDL 48 01/10/2019   LDLCALC 49 01/10/2019   LDLDIRECT 76 11/29/2017   TRIG 456 (H) 01/10/2019   CHOLHDL 3.4 01/10/2019   Lab Results  Component Value Date   NA 135 07/27/2019   K 4.1 07/27/2019   CREATININE 0.68 (L) 07/27/2019   GFRNONAA 117 07/27/2019   GFRAA 135 07/27/2019   GLUCOSE 108 (H) 07/27/2019     Lipid/Cholesterol, Follow-up  Last lipid panel Other pertinent labs  Lab Results  Component Value Date   CHOL 164 01/10/2019   HDL 48 01/10/2019   LDLCALC 49 01/10/2019   LDLDIRECT 76 11/29/2017   TRIG 456 (H) 01/10/2019   CHOLHDL 3.4 01/10/2019   Lab Results  Component Value Date   ALT 40 07/27/2019   AST 21 07/27/2019   PLT 245 01/10/2019    TSH 1.580 01/10/2019     He was last seen for this 6 months ago.  Management since that visit includes no changes.  He reports good compliance with treatment. He is not having side effects.   Symptoms: No chest pain No chest pressure/discomfort  No dyspnea No lower extremity edema  No numbness or tingling of extremity No orthopnea  No palpitations No paroxysmal nocturnal dyspnea  No speech difficulty No syncope   Current diet: well balanced Current exercise: no regular exercise  The 10-year ASCVD risk score Denman George DC Jr., et al., 2013) is: 3.1% Hypertension, follow-up  BP Readings from Last 3 Encounters:  10/11/19 129/84  08/09/19 (!) 152/102  07/27/19 137/90   Wt Readings from Last 3 Encounters:  10/11/19 289 lb 9.6 oz (131.4 kg)  08/09/19 287 lb (130.2 kg)  07/27/19 285 lb (129.3 kg)     He was last seen for hypertension 6 months ago.  BP at that visit was 151/91. Management since that visit includes no changes.  He reports good compliance with treatment. He is not having side effects.  He is following a Regular diet. He is not exercising. He does not smoke.  Use of agents associated with hypertension: none.   Outside blood pressures are not being checked. Symptoms: No  chest pain No chest pressure  No palpitations No syncope  No dyspnea No orthopnea  No paroxysmal nocturnal dyspnea No lower extremity edema       Medications: Outpatient Medications Prior to Visit  Medication Sig  . ACCU-CHEK GUIDE test strip USE TO CHECK SUGAR ONCE IN THE MORNING AS DIRECTED  . atorvastatin (LIPITOR) 10 MG tablet TAKE 1 TABLET BY MOUTH EVERY DAY  . lisinopril-hydrochlorothiazide (ZESTORETIC) 20-12.5 MG tablet TAKE 1 TABLET BY MOUTH EVERY DAY IN THE MORNING  . metFORMIN (GLUCOPHAGE) 1000 MG tablet TAKE 1 TABLET (1,000 MG TOTAL) BY MOUTH 2 (TWO) TIMES DAILY WITH A MEAL.  Marland Kitchen sertraline (ZOLOFT) 100 MG tablet Take 1 tablet (100 mg total) by mouth daily.   No  facility-administered medications prior to visit.    Review of Systems  Constitutional: Negative.   Respiratory: Negative.   Cardiovascular: Negative.   Gastrointestinal: Negative.   Endocrine: Negative.   Musculoskeletal: Negative.   Neurological: Negative.       Objective    BP 129/84 (BP Location: Left Arm, Patient Position: Sitting, Cuff Size: Large)   Pulse 95   Temp 98.4 F (36.9 C) (Oral)   Wt 289 lb 9.6 oz (131.4 kg)   SpO2 97%   BMI 40.39 kg/m    Physical Exam Constitutional:      Appearance: He is obese.  Cardiovascular:     Rate and Rhythm: Normal rate and regular rhythm.     Pulses: Normal pulses.     Heart sounds: Normal heart sounds.  Pulmonary:     Effort: Pulmonary effort is normal.     Breath sounds: Normal breath sounds.  Skin:    General: Skin is dry.  Neurological:     Mental Status: He is alert and oriented to person, place, and time. Mental status is at baseline.  Psychiatric:        Mood and Affect: Mood normal.        Behavior: Behavior normal.       Results for orders placed or performed in visit on 10/11/19  POCT glycosylated hemoglobin (Hb A1C)  Result Value Ref Range   Hemoglobin A1C 7.6 (A) 4.0 - 5.6 %   HbA1c POC (<> result, manual entry)     HbA1c, POC (prediabetic range)     HbA1c, POC (controlled diabetic range)     Est. average glucose Bld gHb Est-mCnc 171     Assessment & Plan    1. Type 2 diabetes mellitus with other specified complication, without long-term current use of insulin (HCC)  Uncontrolled. Continue current medications. Work on lifestyle, follow up 3 months.  UTD on vaccines, foot exam. Not up to date on eye exam. He has been reminded to get an eye exam for the past two years. He says he would like it with Dr. Clydene Pugh.  On ACEi On Statin Discussed diet and exercise F/u in 3 months  - POCT glycosylated hemoglobin (Hb A1C)  2. Hyperlipidemia associated with type 2 diabetes mellitus (HCC)  Previously  well controlled Continue statin Repeat FLP and CMP Goal LDL < 70   3. Hypertension associated with diabetes (HCC)  Well controlled Continue current medications Recheck metabolic panel F/u in 3 months        I, Trey Sailors, PA-C, have reviewed all documentation for this visit. The documentation on 10/15/19 for the exam, diagnosis, procedures, and orders are all accurate and complete.  The entirety of the information documented in the History of Present Illness,  Review of Systems and Physical Exam were personally obtained by me. Portions of this information were initially documented by [facility] and reviewed by me for thoroughness and accuracy.     Maryella Shivers  [facility] [phone] (phone) [phone] (fax)  [facility]

## 2019-10-11 ENCOUNTER — Ambulatory Visit (INDEPENDENT_AMBULATORY_CARE_PROVIDER_SITE_OTHER): Payer: 59 | Admitting: Physician Assistant

## 2019-10-11 ENCOUNTER — Other Ambulatory Visit: Payer: Self-pay

## 2019-10-11 ENCOUNTER — Encounter: Payer: Self-pay | Admitting: Physician Assistant

## 2019-10-11 VITALS — BP 129/84 | HR 95 | Temp 98.4°F | Wt 289.6 lb

## 2019-10-11 DIAGNOSIS — E785 Hyperlipidemia, unspecified: Secondary | ICD-10-CM

## 2019-10-11 DIAGNOSIS — I10 Essential (primary) hypertension: Secondary | ICD-10-CM

## 2019-10-11 DIAGNOSIS — E1159 Type 2 diabetes mellitus with other circulatory complications: Secondary | ICD-10-CM | POA: Diagnosis not present

## 2019-10-11 DIAGNOSIS — E1169 Type 2 diabetes mellitus with other specified complication: Secondary | ICD-10-CM

## 2019-10-11 LAB — POCT GLYCOSYLATED HEMOGLOBIN (HGB A1C)
Est. average glucose Bld gHb Est-mCnc: 171
Hemoglobin A1C: 7.6 % — AB (ref 4.0–5.6)

## 2019-10-11 NOTE — Patient Instructions (Signed)
PLEASE GET YOUR DIABETIC EYE EXAM.

## 2019-12-30 ENCOUNTER — Other Ambulatory Visit: Payer: Self-pay | Admitting: Physician Assistant

## 2019-12-30 DIAGNOSIS — I10 Essential (primary) hypertension: Secondary | ICD-10-CM

## 2019-12-30 NOTE — Telephone Encounter (Signed)
Requested Prescriptions  Pending Prescriptions Disp Refills   lisinopril-hydrochlorothiazide (ZESTORETIC) 20-12.5 MG tablet [Pharmacy Med Name: LISINOPRIL-HCTZ 20-12.5 MG TAB] 90 tablet 1    Sig: TAKE 1 TABLET BY MOUTH EVERY DAY IN THE MORNING     Cardiovascular:  ACEI + Diuretic Combos Failed - 12/30/2019  9:01 AM      Failed - Cr in normal range and within 180 days    Creatinine, Ser  Date Value Ref Range Status  07/27/2019 0.68 (L) 0.76 - 1.27 mg/dL Final         Passed - Na in normal range and within 180 days    Sodium  Date Value Ref Range Status  07/27/2019 135 134 - 144 mmol/L Final         Passed - K in normal range and within 180 days    Potassium  Date Value Ref Range Status  07/27/2019 4.1 3.5 - 5.2 mmol/L Final         Passed - Ca in normal range and within 180 days    Calcium  Date Value Ref Range Status  07/27/2019 9.7 8.7 - 10.2 mg/dL Final         Passed - Patient is not pregnant      Passed - Last BP in normal range    BP Readings from Last 1 Encounters:  10/11/19 129/84         Passed - Valid encounter within last 6 months    Recent Outpatient Visits          2 months ago Type 2 diabetes mellitus with other specified complication, without long-term current use of insulin [facility])   [facility] [place], [place], PA-C   4 months ago Dysuria   [facility] [place], [place], [place]   5 months ago Dysuria   [facility] [place], Ricki Rodriguez M, PA-C   8 months ago Type 2 diabetes mellitus with other specified complication, without long-term current use of insulin [facility])   [facility] [place], Ricki Rodriguez M, [place]   11 months ago Type 2 diabetes mellitus with other specified complication, without long-term current use of insulin [facility])   [facility] [place], Lavella [place], [place]      Future Appointments            In 1 week Trey Sailors, PA-C [facility]

## 2020-01-02 ENCOUNTER — Other Ambulatory Visit: Payer: Self-pay | Admitting: Physician Assistant

## 2020-01-02 DIAGNOSIS — E1169 Type 2 diabetes mellitus with other specified complication: Secondary | ICD-10-CM

## 2020-01-10 ENCOUNTER — Encounter: Payer: Self-pay | Admitting: Physician Assistant

## 2020-01-29 NOTE — Progress Notes (Addendum)
Complete physical exam   Patient: Brent Compton   DOB: [birth year]   44 y.o. Male  MRN: [id number] Visit Date: 01/30/2020  Today's healthcare provider: Trey Sailors, PA-C   Chief Complaint  Patient presents with  . Annual Exam   Subjective    Brent Compton is a 44 y.o. male who presents today for a complete physical exam.  He reports consuming a general diet. Exercises some He generally feels well. He reports sleeping well. He does not have additional problems to discuss today.   Diabetes Mellitus Type II, Follow-up  Lab Results  Component Value Date   HGBA1C 7.6 (A) 10/11/2019   HGBA1C 6.3 (H) 01/10/2019   HGBA1C 6.2 (A) 10/11/2018   Wt Readings from Last 3 Encounters:  01/30/20 285 lb (129.3 kg)  10/11/19 289 lb 9.6 oz (131.4 kg)  08/09/19 287 lb (130.2 kg)   Last seen for diabetes 3 months ago.  Management since then includes monitor diet and increase exercise. He reports excellent compliance with treatment. He is not having side effects.  Symptoms: No fatigue No foot ulcerations  No appetite changes No nausea  No paresthesia of the feet  No polydipsia  No polyuria No visual disturbances   No vomiting     Home blood sugar records: are not being checked.  Episodes of hypoglycemia? No    Current insulin regiment: None Most Recent Eye Exam: Due for an Eye exam Current exercise: walking Current diet habits: in general, a "healthy" diet    Pertinent Labs: Lab Results  Component Value Date   CHOL 164 01/10/2019   HDL 48 01/10/2019   LDLCALC 49 01/10/2019   LDLDIRECT 76 11/29/2017   TRIG 456 (H) 01/10/2019   CHOLHDL 3.4 01/10/2019   Lab Results  Component Value Date   NA 135 07/27/2019   K 4.1 07/27/2019   CREATININE 0.68 (L) 07/27/2019   GFRNONAA 117 07/27/2019   GFRAA 135 07/27/2019   GLUCOSE 108 (H) 07/27/2019      Hypertension, follow-up  BP Readings from Last 3 Encounters:  01/30/20 (!) 131/99  10/11/19 129/84  08/09/19 (!)  152/102   Wt Readings from Last 3 Encounters:  01/30/20 285 lb (129.3 kg)  10/11/19 289 lb 9.6 oz (131.4 kg)  08/09/19 287 lb (130.2 kg)     He was last seen for hypertension 3 months ago.  BP at that visit was 129/84. Management since that visit includes no medication changes.  He reports excellent compliance with treatment. He is not having side effects.  He is following a Regular diet. He is exercising. He does not smoke.  Use of agents associated with hypertension: none.   Outside blood pressures are not being checked. Symptoms: No chest pain No chest pressure  No palpitations No syncope  No dyspnea No orthopnea  No paroxysmal nocturnal dyspnea No lower extremity edema   Pertinent labs: Lab Results  Component Value Date   CHOL 164 01/10/2019   HDL 48 01/10/2019   LDLCALC 49 01/10/2019   LDLDIRECT 76 11/29/2017   TRIG 456 (H) 01/10/2019   CHOLHDL 3.4 01/10/2019   Lab Results  Component Value Date   NA 135 07/27/2019   K 4.1 07/27/2019   CREATININE 0.68 (L) 07/27/2019   GFRNONAA 117 07/27/2019   GFRAA 135 07/27/2019   GLUCOSE 108 (H) 07/27/2019     The 10-year ASCVD risk score Denman George DC Jr., et al., 2013) is: 3.1%   Lipid/Cholesterol, Follow-up  Last  lipid panel Other pertinent labs  Lab Results  Component Value Date   CHOL 164 01/10/2019   HDL 48 01/10/2019   LDLCALC 49 01/10/2019   LDLDIRECT 76 11/29/2017   TRIG 456 (H) 01/10/2019   CHOLHDL 3.4 01/10/2019   Lab Results  Component Value Date   ALT 40 07/27/2019   AST 21 07/27/2019   PLT 245 01/10/2019   TSH 1.580 01/10/2019     He was last seen for this 3 months ago.  Management since that visit includes no medication changes.  He reports excellent compliance with treatment. He is not having side effects.   Wt Readings from Last 3 Encounters:  01/30/20 285 lb (129.3 kg)  10/11/19 289 lb 9.6 oz (131.4 kg)  08/09/19 287 lb (130.2 kg)     Past Medical History:  Diagnosis Date  . GERD  (gastroesophageal reflux disease)    RARE-ROLAIDS PRN  . Headache    RARE MIGRAINES  . Hypertension    Past Surgical History:  Procedure Laterality Date  . KNEE ARTHROSCOPY Left 11/27/2015   Procedure: left knee arthroscopy, medial menisectomy, excision of plica;  Surgeon: Kennedy BuckerMichael Menz, MD;  Location: [facility] ORS;  Service: Orthopedics;  Laterality: Left;  . NO PAST SURGERIES    . VASECTOMY     Social History   Socioeconomic History  . Marital status: Married    Spouse name: Not on file  . Number of children: Not on file  . Years of education: Not on file  . Highest education level: Not on file  Occupational History  . Not on file  Tobacco Use  . Smoking status: Never Smoker  . Smokeless tobacco: Never Used  Vaping Use  . Vaping Use: Never used  Substance and Sexual Activity  . Alcohol use: Yes    Comment: OCC  . Drug use: No  . Sexual activity: Not on file  Other Topics Concern  . Not on file  Social History Narrative  . Not on file   Social Determinants of Health   Financial Resource Strain: Not on file  Food Insecurity: Not on file  Transportation Needs: Not on file  Physical Activity: Not on file  Stress: Not on file  Social Connections: Not on file  Intimate Partner Violence: Not on file   Family Status  Relation Name Status  . Mother  Alive  . Father  Alive  . Neg Hx  (Not Specified)   Family History  Problem Relation Age of Onset  . Diabetes Mother   . Hypertension Mother   . Hypertension Father   . Colon cancer Neg Hx   . Prostate cancer Neg Hx    No Known Allergies  Patient Care Team: Maryella ShiversPollak, Demri Poulton M, PA-C as PCP - General (Physician Assistant)   Medications: Outpatient Medications Prior to Visit  Medication Sig  . ACCU-CHEK GUIDE test strip USE TO CHECK SUGAR ONCE IN THE MORNING AS DIRECTED  . atorvastatin (LIPITOR) 10 MG tablet TAKE 1 TABLET BY MOUTH EVERY DAY  . lisinopril-hydrochlorothiazide (ZESTORETIC) 20-12.5 MG tablet TAKE 1  TABLET BY MOUTH EVERY DAY IN THE MORNING  . metFORMIN (GLUCOPHAGE) 1000 MG tablet TAKE 1 TABLET (1,000 MG TOTAL) BY MOUTH 2 (TWO) TIMES DAILY WITH A MEAL.  Marland Kitchen. sertraline (ZOLOFT) 100 MG tablet Take 1 tablet (100 mg total) by mouth daily.   No facility-administered medications prior to visit.    Review of Systems  Constitutional: Negative.   HENT: Negative.   Eyes: Negative.  Respiratory: Negative.   Cardiovascular: Negative.   Gastrointestinal: Negative.   Endocrine: Negative.   Genitourinary: Negative.   Musculoskeletal: Negative.   Skin: Negative.   Allergic/Immunologic: Negative.   Neurological: Negative.   Hematological: Negative.   Psychiatric/Behavioral: Negative.       Objective    BP (!) 131/99 (BP Location: Right Arm, Patient Position: Sitting, Cuff Size: Large)   Pulse 93   Temp 98.9 F (37.2 C) (Oral)   Ht 5\' 10"  (1.778 m)   Wt 285 lb (129.3 kg)   SpO2 99%   BMI 40.89 kg/m    Physical Exam Constitutional:      Appearance: Normal appearance.  Cardiovascular:     Rate and Rhythm: Normal rate and regular rhythm.     Heart sounds: Normal heart sounds.  Pulmonary:     Effort: Pulmonary effort is normal.     Breath sounds: Normal breath sounds.  Abdominal:     General: Bowel sounds are normal.     Palpations: Abdomen is soft.  Skin:    General: Skin is warm and dry.  Neurological:     Mental Status: He is alert and oriented to person, place, and time. Mental status is at baseline.  Psychiatric:        Mood and Affect: Mood normal.        Behavior: Behavior normal.       Last depression screening scores PHQ 2/9 Scores 01/30/2020 10/15/2019 04/10/2019  PHQ - 2 Score - 0 0  PHQ- 9 Score - 0 0  Exception Documentation Patient refusal - -   Last fall risk screening Fall Risk  01/30/2020  Falls in the past year? 0  Number falls in past yr: 0  Injury with Fall? 0  Risk for fall due to : No Fall Risks  Follow up Falls evaluation completed   Last  Audit-C alcohol use screening Alcohol Use Disorder Test (AUDIT) 01/30/2020  Patient refused Alcohol Screening Tool Yes  1. How often do you have a drink containing alcohol? -  2. How many drinks containing alcohol do you have on a typical day when you are drinking? -  3. How often do you have six or more drinks on one occasion? -  AUDIT-C Score -  Alcohol Brief Interventions/Follow-up Patient Refused   A score of 3 or more in women, and 4 or more in men indicates increased risk for alcohol abuse, EXCEPT if all of the points are from question 1   No results found for any visits on 01/30/20.  Assessment & Plan    Routine Health Maintenance and Physical Exam  Exercise Activities and Dietary recommendations Goals   None     Immunization History  Administered Date(s) Administered  . Pneumococcal Polysaccharide-23 12/06/2017    Health Maintenance  Topic Date Due  . Hepatitis C Screening  Never done  . COVID-19 Vaccine (1) Never done  . OPHTHALMOLOGY EXAM  Never done  . FOOT EXAM  01/10/2020  . INFLUENZA VACCINE  05/01/2020 (Originally 09/02/2019)  . HEMOGLOBIN A1C  04/09/2020  . TETANUS/TDAP  02/15/2028  . PNEUMOCOCCAL POLYSACCHARIDE VACCINE AGE 55-64 HIGH RISK  Completed  . HIV Screening  Completed    Discussed health benefits of physical activity, and encouraged him to engage in regular exercise appropriate for his age and condition.  1. Annual physical exam   2. Anxiety and depression  - sertraline (ZOLOFT) 100 MG tablet; Take 1 tablet (100 mg total) by mouth daily.  Dispense: 90  tablet; Refill: 1 - TSH - Lipid panel - Comprehensive metabolic panel - CBC with Differential/Platelet  3. Hyperlipidemia associated with type 2 diabetes mellitus (HCC)  Continue statin.  - TSH - Lipid panel - Comprehensive metabolic panel - CBC with Differential/Platelet  4. Hypertension associated with diabetes (HCC)  - TSH - Lipid panel - Comprehensive metabolic panel - CBC  with Differential/Platelet  5. Class 2 severe obesity due to excess calories with serious comorbidity and body mass index (BMI) of 37.0 to 37.9 in adult [facility])  Discussed importance of healthy weight management Discussed diet and exercise   6. Type 2 diabetes mellitus with other specified complication, without long-term current use of insulin (HCC)  Worsening. Will start farxiga. Follow up in 1 month.   - Ambulatory referral to Ophthalmology - TSH - Lipid panel - Comprehensive metabolic panel - CBC with Differential/Platelet - HgB A1c  7. Encounter for hepatitis C screening test for low risk patient  - Hepatitis C Antibody    I, Trey Sailors, PA-C, have reviewed all documentation for this visit. The documentation on 01/30/20 for the exam, diagnosis, procedures, and orders are all accurate and complete.  The entirety of the information documented in the History of Present Illness, Review of Systems and Physical Exam were personally obtained by me. Portions of this information were initially documented by [place], CMA and reviewed by me for thoroughness and accuracy.     Maryella Shivers  [facility] [phone] (phone) [phone] (fax)  [facility]

## 2020-01-30 ENCOUNTER — Ambulatory Visit (INDEPENDENT_AMBULATORY_CARE_PROVIDER_SITE_OTHER): Payer: 59 | Admitting: Physician Assistant

## 2020-01-30 ENCOUNTER — Other Ambulatory Visit: Payer: Self-pay

## 2020-01-30 ENCOUNTER — Encounter: Payer: Self-pay | Admitting: Physician Assistant

## 2020-01-30 VITALS — BP 131/99 | HR 93 | Temp 98.9°F | Ht 70.0 in | Wt 285.0 lb

## 2020-01-30 DIAGNOSIS — Z0000 Encounter for general adult medical examination without abnormal findings: Secondary | ICD-10-CM | POA: Diagnosis not present

## 2020-01-30 DIAGNOSIS — F419 Anxiety disorder, unspecified: Secondary | ICD-10-CM | POA: Diagnosis not present

## 2020-01-30 DIAGNOSIS — Z1159 Encounter for screening for other viral diseases: Secondary | ICD-10-CM | POA: Diagnosis not present

## 2020-01-30 DIAGNOSIS — E1169 Type 2 diabetes mellitus with other specified complication: Secondary | ICD-10-CM | POA: Diagnosis not present

## 2020-01-30 DIAGNOSIS — F329 Major depressive disorder, single episode, unspecified: Secondary | ICD-10-CM | POA: Insufficient documentation

## 2020-01-30 DIAGNOSIS — E785 Hyperlipidemia, unspecified: Secondary | ICD-10-CM

## 2020-01-30 DIAGNOSIS — I152 Hypertension secondary to endocrine disorders: Secondary | ICD-10-CM

## 2020-01-30 DIAGNOSIS — E1159 Type 2 diabetes mellitus with other circulatory complications: Secondary | ICD-10-CM

## 2020-01-30 DIAGNOSIS — Z6837 Body mass index (BMI) 37.0-37.9, adult: Secondary | ICD-10-CM

## 2020-01-30 DIAGNOSIS — F32A Depression, unspecified: Secondary | ICD-10-CM

## 2020-01-30 MED ORDER — SERTRALINE HCL 100 MG PO TABS: 100.0000 mg | ORAL_TABLET | Freq: Every day | ORAL | 1 refills | Status: DC

## 2020-01-31 LAB — COMPREHENSIVE METABOLIC PANEL
ALT: 73 IU/L — ABNORMAL HIGH (ref 0–44)
AST: 46 IU/L — ABNORMAL HIGH (ref 0–40)
Albumin/Globulin Ratio: 1.9 (ref 1.2–2.2)
Albumin: 4.5 g/dL (ref 4.0–5.0)
Alkaline Phosphatase: 73 IU/L (ref 44–121)
BUN/Creatinine Ratio: 15 (ref 9–20)
BUN: 11 mg/dL (ref 6–24)
Bilirubin Total: 0.3 mg/dL (ref 0.0–1.2)
CO2: 21 mmol/L (ref 20–29)
Calcium: 9.9 mg/dL (ref 8.7–10.2)
Chloride: 97 mmol/L (ref 96–106)
Creatinine, Ser: 0.75 mg/dL — ABNORMAL LOW (ref 0.76–1.27)
GFR calc Af Amer: 129 mL/min/{1.73_m2} (ref >59)
GFR calc non Af Amer: 112 mL/min/{1.73_m2} (ref >59)
Globulin, Total: 2.4 g/dL (ref 1.5–4.5)
Glucose: 261 mg/dL — ABNORMAL HIGH (ref 65–99)
Potassium: 4.1 mmol/L (ref 3.5–5.2)
Sodium: 134 mmol/L (ref 134–144)
Total Protein: 6.9 g/dL (ref 6.0–8.5)

## 2020-01-31 LAB — LIPID PANEL
Chol/HDL Ratio: 4.3 ratio (ref 0.0–5.0)
Cholesterol, Total: 167 mg/dL (ref 100–199)
HDL: 39 mg/dL — ABNORMAL LOW (ref >39)
LDL Chol Calc (NIH): 47 mg/dL (ref 0–99)
Triglycerides: 563 mg/dL (ref 0–149)
VLDL Cholesterol Cal: 81 mg/dL — ABNORMAL HIGH (ref 5–40)

## 2020-01-31 LAB — CBC WITH DIFFERENTIAL/PLATELET
Basophils Absolute: 0.1 10*3/uL (ref 0.0–0.2)
Basos: 1 %
EOS (ABSOLUTE): 0.2 10*3/uL (ref 0.0–0.4)
Eos: 3 %
Hematocrit: 49.2 % (ref 37.5–51.0)
Hemoglobin: 17.3 g/dL (ref 13.0–17.7)
Immature Grans (Abs): 0 10*3/uL (ref 0.0–0.1)
Immature Granulocytes: 0 %
Lymphocytes Absolute: 2.6 10*3/uL (ref 0.7–3.1)
Lymphs: 34 %
MCH: 31.1 pg (ref 26.6–33.0)
MCHC: 35.2 g/dL (ref 31.5–35.7)
MCV: 89 fL (ref 79–97)
Monocytes Absolute: 0.5 10*3/uL (ref 0.1–0.9)
Monocytes: 6 %
Neutrophils Absolute: 4.2 10*3/uL (ref 1.4–7.0)
Neutrophils: 56 %
Platelets: 222 10*3/uL (ref 150–450)
RBC: 5.56 x10E6/uL (ref 4.14–5.80)
RDW: 12.4 % (ref 11.6–15.4)
WBC: 7.6 10*3/uL (ref 3.4–10.8)

## 2020-01-31 LAB — HEMOGLOBIN A1C
Est. average glucose Bld gHb Est-mCnc: 226 mg/dL
Hgb A1c MFr Bld: 9.5 % — ABNORMAL HIGH (ref 4.8–5.6)

## 2020-01-31 LAB — TSH: TSH: 1.33 u[IU]/mL (ref 0.450–4.500)

## 2020-01-31 LAB — HEPATITIS C ANTIBODY: Hep C Virus Ab: <0.1 s/co ratio (ref 0.0–0.9)

## 2020-01-31 MED ORDER — DAPAGLIFLOZIN PROPANEDIOL 5 MG PO TABS: 5.0000 mg | ORAL_TABLET | Freq: Every day | ORAL | 0 refills | Status: DC

## 2020-01-31 NOTE — Addendum Note (Signed)
Addended by: Trey Sailors on: 01/31/2020 01:11 PM   Modules accepted: Orders

## 2020-02-12 ENCOUNTER — Telehealth: Payer: Self-pay

## 2020-02-12 DIAGNOSIS — E1169 Type 2 diabetes mellitus with other specified complication: Secondary | ICD-10-CM

## 2020-02-12 MED ORDER — GLIPIZIDE ER 5 MG PO TB24: 5.0000 mg | ORAL_TABLET | Freq: Every day | ORAL | 1 refills | Status: DC

## 2020-02-12 NOTE — Telephone Encounter (Signed)
-----   Message from Trey Sailors, [place] sent at 01/31/2020  1:11 PM EST ----- Sent in farxiga 5 mg daily. Please follow up one month. Will cause sugar to be excreted in urine, should help with weight loss.

## 2020-02-12 NOTE — Telephone Encounter (Signed)
Sent in glipizide 5 mg daily, please take in addition to other current medications and take it with a meal.

## 2020-02-12 NOTE — Telephone Encounter (Signed)
Patient was advised and thanks thank you.

## 2020-02-12 NOTE — Telephone Encounter (Signed)
Patient was advised. He reports that he can not afford the [place] monthly due to the cost $400 and he wants to try some thing different but not anything injectable.

## 2020-04-15 ENCOUNTER — Telehealth: Payer: Self-pay

## 2020-04-15 NOTE — Telephone Encounter (Signed)
Patient called [facility] to reschedule appt. From 03/9

## 2020-04-29 ENCOUNTER — Ambulatory Visit: Payer: Self-pay | Admitting: Physician Assistant

## 2020-04-29 ENCOUNTER — Ambulatory Visit (INDEPENDENT_AMBULATORY_CARE_PROVIDER_SITE_OTHER): Payer: 59 | Admitting: Family Medicine

## 2020-04-29 ENCOUNTER — Encounter: Payer: Self-pay | Admitting: Family Medicine

## 2020-04-29 ENCOUNTER — Other Ambulatory Visit: Payer: Self-pay

## 2020-04-29 VITALS — BP 150/97 | HR 94 | Temp 98.9°F | Resp 16 | Ht 71.0 in | Wt 284.0 lb

## 2020-04-29 DIAGNOSIS — F331 Major depressive disorder, recurrent, moderate: Secondary | ICD-10-CM

## 2020-04-29 DIAGNOSIS — E1159 Type 2 diabetes mellitus with other circulatory complications: Secondary | ICD-10-CM

## 2020-04-29 DIAGNOSIS — E1169 Type 2 diabetes mellitus with other specified complication: Secondary | ICD-10-CM

## 2020-04-29 DIAGNOSIS — I10 Essential (primary) hypertension: Secondary | ICD-10-CM

## 2020-04-29 DIAGNOSIS — F411 Generalized anxiety disorder: Secondary | ICD-10-CM | POA: Insufficient documentation

## 2020-04-29 DIAGNOSIS — E785 Hyperlipidemia, unspecified: Secondary | ICD-10-CM

## 2020-04-29 DIAGNOSIS — I152 Hypertension secondary to endocrine disorders: Secondary | ICD-10-CM

## 2020-04-29 LAB — POCT GLYCOSYLATED HEMOGLOBIN (HGB A1C)
Est. average glucose Bld gHb Est-mCnc: 235
Hemoglobin A1C: 9.8 % — AB (ref 4.0–5.6)

## 2020-04-29 MED ORDER — SERTRALINE HCL 100 MG PO TABS
100.0000 mg | ORAL_TABLET | Freq: Every day | ORAL | 1 refills | Status: AC
Start: 2020-04-29 — End: 2020-10-26

## 2020-04-29 MED ORDER — LISINOPRIL-HYDROCHLOROTHIAZIDE 20-12.5 MG PO TABS: 1.0000 | ORAL_TABLET | Freq: Every day | ORAL | 1 refills | Status: DC

## 2020-04-29 MED ORDER — METFORMIN HCL 1000 MG PO TABS: 1000.0000 mg | ORAL_TABLET | Freq: Two times a day (BID) | ORAL | 1 refills | Status: DC

## 2020-04-29 MED ORDER — GLIPIZIDE ER 5 MG PO TB24: 5.0000 mg | ORAL_TABLET | Freq: Every day | ORAL | 1 refills | Status: DC

## 2020-04-29 MED ORDER — ATORVASTATIN CALCIUM 10 MG PO TABS
10.0000 mg | ORAL_TABLET | Freq: Every day | ORAL | 1 refills | Status: AC
Start: 2020-04-29 — End: ?

## 2020-04-29 NOTE — Patient Instructions (Signed)
Diabetes Mellitus and Nutrition, Adult When you have diabetes, or diabetes mellitus, it is very important to have healthy eating habits because your blood sugar (glucose) levels are greatly affected by what you eat and drink. Eating healthy foods in the right amounts, at about the same times every day, can help you:  Control your blood glucose.  Lower your risk of heart disease.  Improve your blood pressure.  Reach or maintain a healthy weight. What can affect my meal plan? Every person with diabetes is different, and each person has different needs for a meal plan. Your health care provider may recommend that you work with a dietitian to make a meal plan that is best for you. Your meal plan may vary depending on factors such as:  The calories you need.  The medicines you take.  Your weight.  Your blood glucose, blood pressure, and cholesterol levels.  Your activity level.  Other health conditions you have, such as heart or kidney disease. How do carbohydrates affect me? Carbohydrates, also called carbs, affect your blood glucose level more than any other type of food. Eating carbs naturally raises the amount of glucose in your blood. Carb counting is a method for keeping track of how many carbs you eat. Counting carbs is important to keep your blood glucose at a healthy level, especially if you use insulin or take certain oral diabetes medicines. It is important to know how many carbs you can safely have in each meal. This is different for every person. Your dietitian can help you calculate how many carbs you should have at each meal and for each snack. How does alcohol affect me? Alcohol can cause a sudden decrease in blood glucose (hypoglycemia), especially if you use insulin or take certain oral diabetes medicines. Hypoglycemia can be a life-threatening condition. Symptoms of hypoglycemia, such as sleepiness, dizziness, and confusion, are similar to symptoms of having too much  alcohol.  Do not drink alcohol if: ? Your health care provider tells you not to drink. ? You are pregnant, may be pregnant, or are planning to become pregnant.  If you drink alcohol: ? Do not drink on an empty stomach. ? Limit how much you use to:  0-1 drink a day for women.  0-2 drinks a day for men. ? Be aware of how much alcohol is in your drink. In the [place] one drink equals one 12 oz bottle of beer (355 mL), one 5 oz glass of wine (148 mL), or one 1 oz glass of hard liquor (44 mL). ? Keep yourself hydrated with water, diet soda, or unsweetened iced tea.  Keep in mind that regular soda, juice, and other mixers may contain a lot of sugar and must be counted as carbs. What are tips for following this plan? Reading food labels  Start by checking the serving size on the "Nutrition Facts" label of packaged foods and drinks. The amount of calories, carbs, fats, and other nutrients listed on the label is based on one serving of the item. Many items contain more than one serving per package.  Check the total grams (g) of carbs in one serving. You can calculate the number of servings of carbs in one serving by dividing the total carbs by 15. For example, if a food has 30 g of total carbs per serving, it would be equal to 2 servings of carbs.  Check the number of grams (g) of saturated fats and trans fats in one serving. Choose foods that have   a low amount or none of these fats.  Check the number of milligrams (mg) of salt (sodium) in one serving. Most people should limit total sodium intake to less than 2,300 mg per day.  Always check the nutrition information of foods labeled as "low-fat" or "nonfat." These foods may be higher in added sugar or refined carbs and should be avoided.  Talk to your dietitian to identify your daily goals for nutrients listed on the label. Shopping  Avoid buying canned, pre-made, or processed foods. These foods tend to be high in fat, sodium, and added  sugar.  Shop around the outside edge of the grocery store. This is where you will most often find fresh fruits and vegetables, bulk grains, fresh meats, and fresh dairy. Cooking  Use low-heat cooking methods, such as baking, instead of high-heat cooking methods like deep frying.  Cook using healthy oils, such as olive, canola, or sunflower oil.  Avoid cooking with butter, cream, or high-fat meats. Meal planning  Eat meals and snacks regularly, preferably at the same times every day. Avoid going long periods of time without eating.  Eat foods that are high in fiber, such as fresh fruits, vegetables, beans, and whole grains. Talk with your dietitian about how many servings of carbs you can eat at each meal.  Eat 4-6 oz (112-168 g) of lean protein each day, such as lean meat, chicken, fish, eggs, or tofu. One ounce (oz) of lean protein is equal to: ? 1 oz (28 g) of meat, chicken, or fish. ? 1 egg. ?  cup (62 g) of tofu.  Eat some foods each day that contain healthy fats, such as avocado, nuts, seeds, and fish.   What foods should I eat? Fruits Berries. Apples. Oranges. Peaches. Apricots. Plums. Grapes. Mango. Papaya. Pomegranate. Kiwi. Cherries. Vegetables Lettuce. Spinach. Leafy greens, including kale, chard, collard greens, and mustard greens. Beets. Cauliflower. Cabbage. Broccoli. Carrots. Green beans. Tomatoes. Peppers. Onions. Cucumbers. Brussels sprouts. Grains Whole grains, such as whole-wheat or whole-grain bread, crackers, tortillas, cereal, and pasta. Unsweetened oatmeal. Quinoa. Brown or wild rice. Meats and other proteins Seafood. Poultry without skin. Lean cuts of poultry and beef. Tofu. Nuts. Seeds. Dairy Low-fat or fat-free dairy products such as milk, yogurt, and cheese. The items listed above may not be a complete list of foods and beverages you can eat. Contact a dietitian for more information. What foods should I avoid? Fruits Fruits canned with  syrup. Vegetables Canned vegetables. Frozen vegetables with butter or cream sauce. Grains Refined white flour and flour products such as bread, pasta, snack foods, and cereals. Avoid all processed foods. Meats and other proteins Fatty cuts of meat. Poultry with skin. Breaded or fried meats. Processed meat. Avoid saturated fats. Dairy Full-fat yogurt, cheese, or milk. Beverages Sweetened drinks, such as soda or iced tea. The items listed above may not be a complete list of foods and beverages you should avoid. Contact a dietitian for more information. Questions to ask a health care provider  Do I need to meet with a diabetes educator?  Do I need to meet with a dietitian?  What number can I call if I have questions?  When are the best times to check my blood glucose? Where to find more information:  American Diabetes Association: diabetes.org  Academy of Nutrition and Dietetics: www.eatright.org  National Institute of Diabetes and Digestive and Kidney Diseases: www.niddk.nih.gov  Association of Diabetes Care and Education Specialists: www.diabeteseducator.org Summary  It is important to have healthy eating   habits because your blood sugar (glucose) levels are greatly affected by what you eat and drink.  A healthy meal plan will help you control your blood glucose and maintain a healthy lifestyle.  Your health care provider may recommend that you work with a dietitian to make a meal plan that is best for you.  Keep in mind that carbohydrates (carbs) and alcohol have immediate effects on your blood glucose levels. It is important to count carbs and to use alcohol carefully. This information is not intended to replace advice given to you by your health care provider. Make sure you discuss any questions you have with your health care provider. Document Revised: 12/26/2018 Document Reviewed: 12/26/2018 Elsevier Patient Education  2021 Elsevier Inc.  

## 2020-04-29 NOTE — Assessment & Plan Note (Signed)
Chronic and uncontrolled Previously well controlled on Zoloft 100 mg daily, which we will resume Okay to titrate slowly to 100 mg daily if getting GI side effects as below

## 2020-04-29 NOTE — Assessment & Plan Note (Signed)
BMI of 39 and associated with HTN, HLD, T2DM Discussed importance of healthy weight management Discussed diet and exercise

## 2020-04-29 NOTE — Assessment & Plan Note (Addendum)
Chronic and uncontrolled with A1c of 9.8 Associated with and complicated by HTN and HLD He has been off of medications for several months Marcelline Deist was unaffordable, but he did not try coupon code from the manufacturer for this Resume Metformin and glipizide at previous doses Advised on diet and exercise Follow-up in 3 months and repeat A1c

## 2020-04-29 NOTE — Progress Notes (Signed)
Established patient visit   Patient: Brent Compton   DOB: Nov 29, 1975   45 y.o. Male  MRN: [id number] Visit Date: 04/29/2020  Today's healthcare provider: Shirlee Latch, MD   Chief Complaint  Patient presents with  . Diabetes   Subjective    HPI  Diabetes Mellitus Type II, Follow-up  Lab Results  Component Value Date   HGBA1C 9.8 (A) 04/29/2020   HGBA1C 9.5 (H) 01/30/2020   HGBA1C 7.6 (A) 10/11/2019   Wt Readings from Last 3 Encounters:  04/29/20 284 lb (128.8 kg)  01/30/20 285 lb (129.3 kg)  10/11/19 289 lb 9.6 oz (131.4 kg)   Last seen for diabetes 3 months ago.  Management since then includes start Farxiga. Marcelline Deist was to expensive, so changed to Glipizide 5mg  daily. He reports poor compliance with treatment. Patient reports he got sick with COVID about two months ago and since then has not taken any of his medications. He is not having side effects.  Symptoms: No fatigue No foot ulcerations  No appetite changes No nausea  No paresthesia of the feet  No polydipsia  No polyuria No visual disturbances   No vomiting     Home blood sugar records: not being checked  Episodes of hypoglycemia? No    Current insulin regiment: none Most Recent Eye Exam: UTD Current exercise: none Current diet habits: in general, an "unhealthy" diet  Pertinent Labs: Lab Results  Component Value Date   CHOL 167 01/30/2020   HDL 39 (L) 01/30/2020   LDLCALC 47 01/30/2020   LDLDIRECT 76 11/29/2017   TRIG 563 (HH) 01/30/2020   CHOLHDL 4.3 01/30/2020   Lab Results  Component Value Date   NA 134 01/30/2020   K 4.1 01/30/2020   CREATININE 0.75 (L) 01/30/2020   GFRNONAA 112 01/30/2020   GFRAA 129 01/30/2020   GLUCOSE 261 (H) 01/30/2020     --------------------------------------------------------------------------------------------------- Hypertension, follow-up  BP Readings from Last 3 Encounters:  04/29/20 (!) 150/97  01/30/20 (!) 131/99  10/11/19 129/84   Wt  Readings from Last 3 Encounters:  04/29/20 284 lb (128.8 kg)  01/30/20 285 lb (129.3 kg)  10/11/19 289 lb 9.6 oz (131.4 kg)     He was last seen for hypertension 3 months ago.  BP at that visit was 131/99. Management since that visit includes no changes.  He reports poor compliance with treatment. Patient reports he got sick with COVID about two months ago and since then has not taken any of his medications. He is not having side effects.  He is following a Regular diet. He is not exercising. He does not smoke.  Use of agents associated with hypertension: none.   Outside blood pressures are not being checked. Symptoms: No chest pain No chest pressure  No palpitations No syncope  No dyspnea No orthopnea  No paroxysmal nocturnal dyspnea No lower extremity edema   Pertinent labs: Lab Results  Component Value Date   CHOL 167 01/30/2020   HDL 39 (L) 01/30/2020   LDLCALC 47 01/30/2020   LDLDIRECT 76 11/29/2017   TRIG 563 (HH) 01/30/2020   CHOLHDL 4.3 01/30/2020   Lab Results  Component Value Date   NA 134 01/30/2020   K 4.1 01/30/2020   CREATININE 0.75 (L) 01/30/2020   GFRNONAA 112 01/30/2020   GFRAA 129 01/30/2020   GLUCOSE 261 (H) 01/30/2020     The 10-year ASCVD risk score 02/01/2020 DC Jr., et al., 2013) is: 5.4%   --------------------------------------------------------------------------------------------------- Lipid/Cholesterol, Follow-up  Last lipid panel Other pertinent labs  Lab Results  Component Value Date   CHOL 167 01/30/2020   HDL 39 (L) 01/30/2020   LDLCALC 47 01/30/2020   LDLDIRECT 76 11/29/2017   TRIG 563 (HH) 01/30/2020   CHOLHDL 4.3 01/30/2020   Lab Results  Component Value Date   ALT 73 (H) 01/30/2020   AST 46 (H) 01/30/2020   PLT 222 01/30/2020   TSH 1.330 01/30/2020     He was last seen for this 3 months ago.  Management since that visit includes no changes, continue current medicatins.  He reports poor compliance with treatment. Patient  reports he got sick with COVID about two months ago and since then has not taken any of his medications. He is not having side effects.   Symptoms: No chest pain No chest pressure/discomfort  No dyspnea No lower extremity edema  No numbness or tingling of extremity No orthopnea  No palpitations No paroxysmal nocturnal dyspnea  No speech difficulty No syncope   Current diet: in general, a "healthy" diet   Current exercise: no regular exercise  The 10-year ASCVD risk score Denman George DC Jr., et al., 2013) is: 5.4%  --------------------------------------------------------------------------------------------------- Feels like Zoloft was helpful. Willing to resume.  Patient Active Problem List   Diagnosis Date Noted  . GAD (generalized anxiety disorder) 04/29/2020  . MDD (major depressive disorder) 01/30/2020  . Morbid obesity (HCC) 04/10/2019  . Hyperlipidemia associated with type 2 diabetes mellitus (HCC) 03/16/2018  . Hypertension associated with diabetes (HCC) 01/06/2018  . Diabetes mellitus (HCC) 12/02/2017   Social History   Tobacco Use  . Smoking status: Never Smoker  . Smokeless tobacco: Never Used  Vaping Use  . Vaping Use: Never used  Substance Use Topics  . Alcohol use: Yes    Comment: OCC  . Drug use: No   No Known Allergies     Medications: Outpatient Medications Prior to Visit  Medication Sig  . ACCU-CHEK GUIDE test strip USE TO CHECK SUGAR ONCE IN THE MORNING AS DIRECTED (Patient not taking: Reported on 04/29/2020)  . [DISCONTINUED] atorvastatin (LIPITOR) 10 MG tablet TAKE 1 TABLET BY MOUTH EVERY DAY (Patient not taking: Reported on 04/29/2020)  . [DISCONTINUED] dapagliflozin propanediol (FARXIGA) 5 MG TABS tablet Take 1 tablet (5 mg total) by mouth daily before breakfast. (Patient not taking: Reported on 04/29/2020)  . [DISCONTINUED] glipiZIDE (GLUCOTROL XL) 5 MG 24 hr tablet Take 1 tablet (5 mg total) by mouth daily with breakfast. (Patient not taking: Reported on  04/29/2020)  . [DISCONTINUED] lisinopril-hydrochlorothiazide (ZESTORETIC) 20-12.5 MG tablet TAKE 1 TABLET BY MOUTH EVERY DAY IN THE MORNING (Patient not taking: Reported on 04/29/2020)  . [DISCONTINUED] metFORMIN (GLUCOPHAGE) 1000 MG tablet TAKE 1 TABLET (1,000 MG TOTAL) BY MOUTH 2 (TWO) TIMES DAILY WITH A MEAL.  . [DISCONTINUED] sertraline (ZOLOFT) 100 MG tablet Take 1 tablet (100 mg total) by mouth daily. (Patient not taking: Reported on 04/29/2020)   No facility-administered medications prior to visit.    Review of Systems  Constitutional: Negative for appetite change and fatigue.  Eyes: Negative for visual disturbance.  Respiratory: Negative for chest tightness and shortness of breath.   Cardiovascular: Negative for chest pain, palpitations and leg swelling.    Last thyroid functions Lab Results  Component Value Date   TSH 1.330 01/30/2020      Objective    BP (!) 150/97 (BP Location: Left Arm, Patient Position: Sitting, Cuff Size: Large)   Pulse 94   Temp 98.9 F (37.2  C) (Oral)   Resp 16   Ht 5\' 11"  (1.803 m)   Wt 284 lb (128.8 kg)   BMI 39.61 kg/m  BP Readings from Last 3 Encounters:  04/29/20 (!) 150/97  01/30/20 (!) 131/99  10/11/19 129/84   Wt Readings from Last 3 Encounters:  04/29/20 284 lb (128.8 kg)  01/30/20 285 lb (129.3 kg)  10/11/19 289 lb 9.6 oz (131.4 kg)      Physical Exam Vitals reviewed.  Constitutional:      General: He is not in acute distress.    Appearance: Normal appearance. He is not diaphoretic.  HENT:     Head: Normocephalic and atraumatic.  Eyes:     General: No scleral icterus.    Conjunctiva/sclera: Conjunctivae normal.  Cardiovascular:     Rate and Rhythm: Normal rate and regular rhythm.     Pulses: Normal pulses.     Heart sounds: Normal heart sounds. No murmur heard.   Pulmonary:     Effort: Pulmonary effort is normal. No respiratory distress.     Breath sounds: Normal breath sounds. No wheezing or rhonchi.  Abdominal:      General: There is no distension.     Palpations: Abdomen is soft.     Tenderness: There is no abdominal tenderness.  Musculoskeletal:     Cervical back: Neck supple.     Right lower leg: No edema.     Left lower leg: No edema.  Lymphadenopathy:     Cervical: No cervical adenopathy.  Skin:    General: Skin is warm and dry.     Capillary Refill: Capillary refill takes less than 2 seconds.     Findings: No rash.  Neurological:     Mental Status: He is alert and oriented to person, place, and time.     Cranial Nerves: No cranial nerve deficit.  Psychiatric:        Mood and Affect: Mood normal.        Behavior: Behavior normal.       Results for orders placed or performed in visit on 04/29/20  POCT glycosylated hemoglobin (Hb A1C)  Result Value Ref Range   Hemoglobin A1C 9.8 (A) 4.0 - 5.6 %   Est. average glucose Bld gHb Est-mCnc 235     Assessment & Plan     Problem List Items Addressed This Visit      Cardiovascular and Mediastinum   Hypertension associated with diabetes (HCC)    Chronic and uncontrolled He has been off of medicines for multiple months Discussed the importance of taking medications regularly to control this to avoid long-term complications of untreated hypertension Resume lisinopril-HCTZ at previous dose Recheck metabolic panel at next visit Return precautions discussed      Relevant Medications   glipiZIDE (GLUCOTROL XL) 5 MG 24 hr tablet   metFORMIN (GLUCOPHAGE) 1000 MG tablet   atorvastatin (LIPITOR) 10 MG tablet   lisinopril-hydrochlorothiazide (ZESTORETIC) 20-12.5 MG tablet     Endocrine   Diabetes mellitus (HCC) - Primary    Chronic and uncontrolled with A1c of 9.8 Associated with and complicated by HTN and HLD He has been off of medications for several months Marcelline DeistFarxiga was unaffordable, but he did not try coupon code from the manufacturer for this Resume Metformin and glipizide at previous doses Advised on diet and exercise Follow-up  in 3 months and repeat A1c      Relevant Medications   glipiZIDE (GLUCOTROL XL) 5 MG 24 hr tablet   metFORMIN (GLUCOPHAGE) 1000  MG tablet   atorvastatin (LIPITOR) 10 MG tablet   lisinopril-hydrochlorothiazide (ZESTORETIC) 20-12.5 MG tablet   Other Relevant Orders   POCT glycosylated hemoglobin (Hb A1C) (Completed)   Hyperlipidemia associated with type 2 diabetes mellitus (HCC)    Chronic and likely uncontrolled given that he has been off of atorvastatin for about 3 months We will resume atorvastatin at previous dose Plan to recheck CMP and lipid panel at next visit in about 3 months      Relevant Medications   glipiZIDE (GLUCOTROL XL) 5 MG 24 hr tablet   metFORMIN (GLUCOPHAGE) 1000 MG tablet   atorvastatin (LIPITOR) 10 MG tablet   lisinopril-hydrochlorothiazide (ZESTORETIC) 20-12.5 MG tablet     Other   Morbid obesity (HCC)    BMI of 39 and associated with HTN, HLD, T2DM Discussed importance of healthy weight management Discussed diet and exercise       Relevant Medications   glipiZIDE (GLUCOTROL XL) 5 MG 24 hr tablet   metFORMIN (GLUCOPHAGE) 1000 MG tablet   MDD (major depressive disorder)    Chronic and uncontrolled Was doing well previously on Zoloft and would like to resume this We will resume at 100 mg daily, which was his previous dose, but if he is getting GI side effects, he could take 50 mg daily for 2 weeks and then increase to 100 mg daily Repeat PHQ-9 and GAD-7 at next visit in 3 months to ensure no need for dose titration Contract for safety-no SI      Relevant Medications   sertraline (ZOLOFT) 100 MG tablet   GAD (generalized anxiety disorder)    Chronic and uncontrolled Previously well controlled on Zoloft 100 mg daily, which we will resume Okay to titrate slowly to 100 mg daily if getting GI side effects as below      Relevant Medications   sertraline (ZOLOFT) 100 MG tablet    Other Visit Diagnoses    Hypertension, unspecified type       Relevant  Medications   atorvastatin (LIPITOR) 10 MG tablet   lisinopril-hydrochlorothiazide (ZESTORETIC) 20-12.5 MG tablet       Return in about 3 months (around 07/30/2020) for chronic disease f/u.      I, Shirlee Latch, MD, have reviewed all documentation for this visit. The documentation on 04/29/20 for the exam, diagnosis, procedures, and orders are all accurate and complete.   Nicholad Kautzman, Marzella Schlein, MD, MPH [facility]

## 2020-04-29 NOTE — Assessment & Plan Note (Signed)
Chronic and uncontrolled He has been off of medicines for multiple months Discussed the importance of taking medications regularly to control this to avoid long-term complications of untreated hypertension Resume lisinopril-HCTZ at previous dose Recheck metabolic panel at next visit Return precautions discussed

## 2020-04-29 NOTE — Assessment & Plan Note (Signed)
Chronic and likely uncontrolled given that he has been off of atorvastatin for about 3 months We will resume atorvastatin at previous dose Plan to recheck CMP and lipid panel at next visit in about 3 months

## 2020-04-29 NOTE — Assessment & Plan Note (Signed)
Chronic and uncontrolled Was doing well previously on Zoloft and would like to resume this We will resume at 100 mg daily, which was his previous dose, but if he is getting GI side effects, he could take 50 mg daily for 2 weeks and then increase to 100 mg daily Repeat PHQ-9 and GAD-7 at next visit in 3 months to ensure no need for dose titration Contract for safety-no SI

## 2020-07-31 ENCOUNTER — Other Ambulatory Visit: Payer: Self-pay

## 2020-07-31 ENCOUNTER — Encounter: Payer: Self-pay | Admitting: Family Medicine

## 2020-07-31 ENCOUNTER — Ambulatory Visit (INDEPENDENT_AMBULATORY_CARE_PROVIDER_SITE_OTHER): Payer: 59 | Admitting: Family Medicine

## 2020-07-31 VITALS — BP 131/89 | HR 80 | Temp 97.7°F | Resp 16 | Ht 71.0 in | Wt 284.0 lb

## 2020-07-31 DIAGNOSIS — F411 Generalized anxiety disorder: Secondary | ICD-10-CM | POA: Diagnosis not present

## 2020-07-31 DIAGNOSIS — F331 Major depressive disorder, recurrent, moderate: Secondary | ICD-10-CM

## 2020-07-31 DIAGNOSIS — E1159 Type 2 diabetes mellitus with other circulatory complications: Secondary | ICD-10-CM

## 2020-07-31 DIAGNOSIS — E785 Hyperlipidemia, unspecified: Secondary | ICD-10-CM

## 2020-07-31 DIAGNOSIS — I152 Hypertension secondary to endocrine disorders: Secondary | ICD-10-CM

## 2020-07-31 DIAGNOSIS — E1169 Type 2 diabetes mellitus with other specified complication: Secondary | ICD-10-CM

## 2020-07-31 NOTE — Assessment & Plan Note (Signed)
Chronic and uncontrolled with last A1c 9.8 Associated with HTN and HLD Continue current meds Advised on diet and exercise Upcoming eye exam F/u in 3 months Repeat A1c

## 2020-07-31 NOTE — Patient Instructions (Signed)
Diabetes Mellitus and Nutrition, Adult When you have diabetes, or diabetes mellitus, it is very important to have healthy eating habits because your blood sugar (glucose) levels are greatly affected by what you eat and drink. Eating healthy foods in the right amounts, at about the same times every day, can help you:  Control your blood glucose.  Lower your risk of heart disease.  Improve your blood pressure.  Reach or maintain a healthy weight. What can affect my meal plan? Every person with diabetes is different, and each person has different needs for a meal plan. Your health care provider may recommend that you work with a dietitian to make a meal plan that is best for you. Your meal plan may vary depending on factors such as:  The calories you need.  The medicines you take.  Your weight.  Your blood glucose, blood pressure, and cholesterol levels.  Your activity level.  Other health conditions you have, such as heart or kidney disease. How do carbohydrates affect me? Carbohydrates, also called carbs, affect your blood glucose level more than any other type of food. Eating carbs naturally raises the amount of glucose in your blood. Carb counting is a method for keeping track of how many carbs you eat. Counting carbs is important to keep your blood glucose at a healthy level, especially if you use insulin or take certain oral diabetes medicines. It is important to know how many carbs you can safely have in each meal. This is different for every person. Your dietitian can help you calculate how many carbs you should have at each meal and for each snack. How does alcohol affect me? Alcohol can cause a sudden decrease in blood glucose (hypoglycemia), especially if you use insulin or take certain oral diabetes medicines. Hypoglycemia can be a life-threatening condition. Symptoms of hypoglycemia, such as sleepiness, dizziness, and confusion, are similar to symptoms of having too much  alcohol.  Do not drink alcohol if: ? Your health care provider tells you not to drink. ? You are pregnant, may be pregnant, or are planning to become pregnant.  If you drink alcohol: ? Do not drink on an empty stomach. ? Limit how much you use to:  0-1 drink a day for women.  0-2 drinks a day for men. ? Be aware of how much alcohol is in your drink. In the [place] one drink equals one 12 oz bottle of beer (355 mL), one 5 oz glass of wine (148 mL), or one 1 oz glass of hard liquor (44 mL). ? Keep yourself hydrated with water, diet soda, or unsweetened iced tea.  Keep in mind that regular soda, juice, and other mixers may contain a lot of sugar and must be counted as carbs. What are tips for following this plan? Reading food labels  Start by checking the serving size on the "Nutrition Facts" label of packaged foods and drinks. The amount of calories, carbs, fats, and other nutrients listed on the label is based on one serving of the item. Many items contain more than one serving per package.  Check the total grams (g) of carbs in one serving. You can calculate the number of servings of carbs in one serving by dividing the total carbs by 15. For example, if a food has 30 g of total carbs per serving, it would be equal to 2 servings of carbs.  Check the number of grams (g) of saturated fats and trans fats in one serving. Choose foods that have   a low amount or none of these fats.  Check the number of milligrams (mg) of salt (sodium) in one serving. Most people should limit total sodium intake to less than 2,300 mg per day.  Always check the nutrition information of foods labeled as "low-fat" or "nonfat." These foods may be higher in added sugar or refined carbs and should be avoided.  Talk to your dietitian to identify your daily goals for nutrients listed on the label. Shopping  Avoid buying canned, pre-made, or processed foods. These foods tend to be high in fat, sodium, and added  sugar.  Shop around the outside edge of the grocery store. This is where you will most often find fresh fruits and vegetables, bulk grains, fresh meats, and fresh dairy. Cooking  Use low-heat cooking methods, such as baking, instead of high-heat cooking methods like deep frying.  Cook using healthy oils, such as olive, canola, or sunflower oil.  Avoid cooking with butter, cream, or high-fat meats. Meal planning  Eat meals and snacks regularly, preferably at the same times every day. Avoid going long periods of time without eating.  Eat foods that are high in fiber, such as fresh fruits, vegetables, beans, and whole grains. Talk with your dietitian about how many servings of carbs you can eat at each meal.  Eat 4-6 oz (112-168 g) of lean protein each day, such as lean meat, chicken, fish, eggs, or tofu. One ounce (oz) of lean protein is equal to: ? 1 oz (28 g) of meat, chicken, or fish. ? 1 egg. ?  cup (62 g) of tofu.  Eat some foods each day that contain healthy fats, such as avocado, nuts, seeds, and fish.   What foods should I eat? Fruits Berries. Apples. Oranges. Peaches. Apricots. Plums. Grapes. Mango. Papaya. Pomegranate. Kiwi. Cherries. Vegetables Lettuce. Spinach. Leafy greens, including kale, chard, collard greens, and mustard greens. Beets. Cauliflower. Cabbage. Broccoli. Carrots. Green beans. Tomatoes. Peppers. Onions. Cucumbers. Brussels sprouts. Grains Whole grains, such as whole-wheat or whole-grain bread, crackers, tortillas, cereal, and pasta. Unsweetened oatmeal. Quinoa. Brown or wild rice. Meats and other proteins Seafood. Poultry without skin. Lean cuts of poultry and beef. Tofu. Nuts. Seeds. Dairy Low-fat or fat-free dairy products such as milk, yogurt, and cheese. The items listed above may not be a complete list of foods and beverages you can eat. Contact a dietitian for more information. What foods should I avoid? Fruits Fruits canned with  syrup. Vegetables Canned vegetables. Frozen vegetables with butter or cream sauce. Grains Refined white flour and flour products such as bread, pasta, snack foods, and cereals. Avoid all processed foods. Meats and other proteins Fatty cuts of meat. Poultry with skin. Breaded or fried meats. Processed meat. Avoid saturated fats. Dairy Full-fat yogurt, cheese, or milk. Beverages Sweetened drinks, such as soda or iced tea. The items listed above may not be a complete list of foods and beverages you should avoid. Contact a dietitian for more information. Questions to ask a health care provider  Do I need to meet with a diabetes educator?  Do I need to meet with a dietitian?  What number can I call if I have questions?  When are the best times to check my blood glucose? Where to find more information:  American Diabetes Association: diabetes.org  Academy of Nutrition and Dietetics: www.eatright.org  National Institute of Diabetes and Digestive and Kidney Diseases: www.niddk.nih.gov  Association of Diabetes Care and Education Specialists: www.diabeteseducator.org Summary  It is important to have healthy eating   habits because your blood sugar (glucose) levels are greatly affected by what you eat and drink.  A healthy meal plan will help you control your blood glucose and maintain a healthy lifestyle.  Your health care provider may recommend that you work with a dietitian to make a meal plan that is best for you.  Keep in mind that carbohydrates (carbs) and alcohol have immediate effects on your blood glucose levels. It is important to count carbs and to use alcohol carefully. This information is not intended to replace advice given to you by your health care provider. Make sure you discuss any questions you have with your health care provider. Document Revised: 12/26/2018 Document Reviewed: 12/26/2018 Elsevier Patient Education  2021 Elsevier Inc.  

## 2020-07-31 NOTE — Assessment & Plan Note (Signed)
Previously well controlled Continue statin Repeat FLP and CMP Goal LDL < 70 

## 2020-07-31 NOTE — Progress Notes (Signed)
Established patient visit   Patient: Brent Compton   DOB: Aug 30, 1975   45 y.o. Male  MRN: [id number] Visit Date: 07/31/2020  Today's healthcare provider: Shirlee Latch, MD   Chief Complaint  Patient presents with   Follow-up   Diabetes   Hypertension   Hyperlipidemia   Subjective    Diabetes Pertinent negatives for hypoglycemia include no dizziness or headaches. Pertinent negatives for diabetes include no chest pain, no fatigue and no weakness.  Hypertension Pertinent negatives include no chest pain, headaches, neck pain, palpitations or shortness of breath.  Hyperlipidemia Pertinent negatives include no chest pain or shortness of breath.    Diabetes Mellitus Type II, follow-up  Lab Results  Component Value Date   HGBA1C 9.8 (A) 04/29/2020   HGBA1C 9.5 (H) 01/30/2020   HGBA1C 7.6 (A) 10/11/2019   Last seen for diabetes 3 months ago.  Management since then includes; advised to resume Metformin and glipizide at previous doses. Advised on diet and exercise. He reports fair compliance with treatment. He is not having side effects. none  Home blood sugar records: fasting range: none  Episodes of hypoglycemia? No none   Current insulin regiment: n/a Most Recent Eye Exam: overdue  -----------------------------------------------------------------------  Hypertension, follow-up  BP Readings from Last 3 Encounters:  07/31/20 131/89  04/29/20 (!) 150/97  01/30/20 (!) 131/99   Wt Readings from Last 3 Encounters:  07/31/20 284 lb (128.8 kg)  04/29/20 284 lb (128.8 kg)  01/30/20 285 lb (129.3 kg)     He was last seen for hypertension 3 months ago.  BP at that visit was 150/97. Management since that visit includes; advised to resume lisinopril-HCTZ at previous dose. He reports fair compliance with treatment. He is not having side effects. none He is not exercising. He is not adherent to low salt diet.   Outside blood pressures are not checking.  He does  not smoke.  Use of agents associated with hypertension: none.   -----------------------------------------------------------------------  Lipid/Cholesterol, follow-up  Last Lipid Panel: Lab Results  Component Value Date   CHOL 167 01/30/2020   LDLCALC 47 01/30/2020   LDLDIRECT 76 11/29/2017   HDL 39 (L) 01/30/2020   TRIG 563 (HH) 01/30/2020   He was last seen for this 6 months ago.  Management since that visit includes; advised to resume 10 mg atorvastatin.  He reports fair compliance with treatment. He is not having side effects. none  He is following a Regular diet. Current exercise: none  Last metabolic panel Lab Results  Component Value Date   GLUCOSE 261 (H) 01/30/2020   NA 134 01/30/2020   K 4.1 01/30/2020   BUN 11 01/30/2020   CREATININE 0.75 (L) 01/30/2020   GFRNONAA 112 01/30/2020   GFRAA 129 01/30/2020   CALCIUM 9.9 01/30/2020   AST 46 (H) 01/30/2020   ALT 73 (H) 01/30/2020   The 10-year ASCVD risk score Denman George DC Jr., et al., 2013) is: 4.2%  -----------------------------------------------------------------------      Medications: Outpatient Medications Prior to Visit  Medication Sig   atorvastatin (LIPITOR) 10 MG tablet Take 1 tablet (10 mg total) by mouth daily.   glipiZIDE (GLUCOTROL XL) 5 MG 24 hr tablet Take 1 tablet (5 mg total) by mouth daily with breakfast.   lisinopril-hydrochlorothiazide (ZESTORETIC) 20-12.5 MG tablet Take 1 tablet by mouth daily.   sertraline (ZOLOFT) 100 MG tablet Take 1 tablet (100 mg total) by mouth daily.   metFORMIN (GLUCOPHAGE) 1000 MG tablet Take 1 tablet (  1,000 mg total) by mouth 2 (two) times daily with a meal.   [DISCONTINUED] ACCU-CHEK GUIDE test strip USE TO CHECK SUGAR ONCE IN THE MORNING AS DIRECTED (Patient not taking: No sig reported)   No facility-administered medications prior to visit.    Review of Systems  Constitutional:  Negative for appetite change, chills, fatigue and fever.  HENT:  Negative for  ear pain, sinus pressure, sinus pain and sore throat.   Eyes:  Negative for pain and visual disturbance.  Respiratory:  Negative for cough, chest tightness, shortness of breath and wheezing.   Cardiovascular:  Negative for chest pain, palpitations and leg swelling.  Gastrointestinal:  Negative for abdominal pain, diarrhea, nausea and vomiting.  Genitourinary:  Negative for flank pain, frequency and urgency.  Musculoskeletal:  Negative for back pain and neck pain.  Neurological:  Negative for dizziness, weakness, light-headedness, numbness and headaches.      Objective    BP 131/89 (BP Location: Left Arm, Patient Position: Sitting, Cuff Size: Large)   Pulse 80   Temp 97.7 F (36.5 C) (Temporal)   Resp 16   Ht 5\' 11"  (1.803 m)   Wt 284 lb (128.8 kg)   SpO2 97%   BMI 39.61 kg/m     Physical Exam Vitals reviewed.  Constitutional:      General: He is not in acute distress.    Appearance: Normal appearance. He is not diaphoretic.  HENT:     Head: Normocephalic and atraumatic.  Eyes:     General: No scleral icterus.    Conjunctiva/sclera: Conjunctivae normal.  Cardiovascular:     Rate and Rhythm: Normal rate and regular rhythm.     Pulses: Normal pulses.     Heart sounds: Normal heart sounds. No murmur heard. Pulmonary:     Effort: Pulmonary effort is normal. No respiratory distress.     Breath sounds: Normal breath sounds. No wheezing or rhonchi.  Abdominal:     General: There is no distension.     Palpations: Abdomen is soft.     Tenderness: There is no abdominal tenderness.  Musculoskeletal:     Cervical back: Neck supple.     Right lower leg: No edema.     Left lower leg: No edema.  Lymphadenopathy:     Cervical: No cervical adenopathy.  Skin:    General: Skin is warm and dry.     Capillary Refill: Capillary refill takes less than 2 seconds.     Findings: No rash.  Neurological:     Mental Status: He is alert and oriented to person, place, and time.     Cranial  Nerves: No cranial nerve deficit.  Psychiatric:        Mood and Affect: Mood normal.        Behavior: Behavior normal.    No results found for any visits on 07/31/20.  Assessment & Plan     Problem List Items Addressed This Visit       Cardiovascular and Mediastinum   Hypertension associated with diabetes (HCC)    Well controlled Continue current medications Recheck metabolic panel        Relevant Orders   Comprehensive metabolic panel     Endocrine   Diabetes mellitus (HCC) - Primary    Chronic and uncontrolled with last A1c 9.8 Associated with HTN and HLD Continue current meds Advised on diet and exercise Upcoming eye exam F/u in 3 months Repeat A1c       Relevant Orders  Hemoglobin A1c   Hyperlipidemia associated with type 2 diabetes mellitus (HCC)    Previously well controlled Continue statin Repeat FLP and CMP Goal LDL < 70       Relevant Orders   Lipid panel   Comprehensive metabolic panel     Other   Morbid obesity (HCC)    BMI 39 and assoc with DM, HTN, HLD Discussed importance of healthy weight management Discussed diet and exercise        MDD (major depressive disorder)    Chronic and well controlled Continue zoloft at current dose Encourage therapy       GAD (generalized anxiety disorder)    Chronic and well controlled Continue zoloft at current dose Encourage therapy        Return in about 3 months (around 10/31/2020) for chronic disease f/u.      I,Essence Turner,acting as a Neurosurgeon for Shirlee Latch, MD.,have documented all relevant documentation on the behalf of Shirlee Latch, MD,as directed by  Shirlee Latch, MD while in the presence of Shirlee Latch, MD.  I, Shirlee Latch, MD, have reviewed all documentation for this visit. The documentation on 07/31/20 for the exam, diagnosis, procedures, and orders are all accurate and complete.   Kerigan Narvaez, Marzella Schlein, MD, MPH [facility]

## 2020-07-31 NOTE — Assessment & Plan Note (Signed)
Well controlled Continue current medications Recheck metabolic panel 

## 2020-07-31 NOTE — Assessment & Plan Note (Signed)
BMI 39 and assoc with DM, HTN, HLD Discussed importance of healthy weight management Discussed diet and exercise

## 2020-07-31 NOTE — Assessment & Plan Note (Signed)
Chronic and well controlled Continue zoloft at current dose Encourage therapy 

## 2020-07-31 NOTE — Assessment & Plan Note (Signed)
Chronic and well controlled Continue zoloft at current dose Encourage therapy

## 2020-08-01 LAB — COMPREHENSIVE METABOLIC PANEL
ALT: 92 IU/L — ABNORMAL HIGH (ref 0–44)
AST: 59 IU/L — ABNORMAL HIGH (ref 0–40)
Albumin/Globulin Ratio: 2.4 — ABNORMAL HIGH (ref 1.2–2.2)
Albumin: 5 g/dL (ref 4.0–5.0)
Alkaline Phosphatase: 64 IU/L (ref 44–121)
BUN/Creatinine Ratio: 14 (ref 9–20)
BUN: 10 mg/dL (ref 6–24)
Bilirubin Total: 0.4 mg/dL (ref 0.0–1.2)
CO2: 21 mmol/L (ref 20–29)
Calcium: 9.8 mg/dL (ref 8.7–10.2)
Chloride: 96 mmol/L (ref 96–106)
Creatinine, Ser: 0.72 mg/dL — ABNORMAL LOW (ref 0.76–1.27)
Globulin, Total: 2.1 g/dL (ref 1.5–4.5)
Glucose: 215 mg/dL — ABNORMAL HIGH (ref 65–99)
Potassium: 4.5 mmol/L (ref 3.5–5.2)
Sodium: 136 mmol/L (ref 134–144)
Total Protein: 7.1 g/dL (ref 6.0–8.5)
eGFR: 116 mL/min/{1.73_m2} (ref >59)

## 2020-08-01 LAB — LIPID PANEL
Chol/HDL Ratio: 3.4 ratio (ref 0.0–5.0)
Cholesterol, Total: 160 mg/dL (ref 100–199)
HDL: 47 mg/dL (ref >39)
LDL Chol Calc (NIH): 69 mg/dL (ref 0–99)
Triglycerides: 274 mg/dL — ABNORMAL HIGH (ref 0–149)
VLDL Cholesterol Cal: 44 mg/dL — ABNORMAL HIGH (ref 5–40)

## 2020-08-01 LAB — HEMOGLOBIN A1C
Est. average glucose Bld gHb Est-mCnc: 186 mg/dL
Hgb A1c MFr Bld: 8.1 % — ABNORMAL HIGH (ref 4.8–5.6)

## 2020-09-11 ENCOUNTER — Encounter: Payer: Self-pay | Admitting: Nurse Practitioner

## 2020-09-11 ENCOUNTER — Ambulatory Visit (INDEPENDENT_AMBULATORY_CARE_PROVIDER_SITE_OTHER): Payer: 59 | Admitting: Nurse Practitioner

## 2020-09-11 ENCOUNTER — Other Ambulatory Visit: Payer: Self-pay

## 2020-09-11 VITALS — BP 127/86 | HR 91 | Temp 98.6°F | Wt 285.6 lb

## 2020-09-11 DIAGNOSIS — H6690 Otitis media, unspecified, unspecified ear: Secondary | ICD-10-CM

## 2020-09-11 MED ORDER — AMOXICILLIN-POT CLAVULANATE 875-125 MG PO TABS: 1.0000 | ORAL_TABLET | Freq: Two times a day (BID) | ORAL | 0 refills | Status: DC

## 2020-09-11 NOTE — Progress Notes (Signed)
Acute Office Visit  Subjective:    Patient ID: Brent Compton, male    DOB: Dec 04, 1975, 45 y.o.   MRN: [id number]  Chief Complaint  Patient presents with   Ear Pain    Pt states he has been having R ear pain for the last few days     HPI Patient is in today for right ear pain x1 week. He started noticing some irritation when he was at the beach a week ago.   EAR PAIN  Duration: 1 weeks Involved ear(s): right Severity:  3/10  Quality:  sore Fever: no Otorrhea:  unsure, woke up with small amount of blood on pillow Upper respiratory infection symptoms: no Pruritus: no Hearing loss:  muffled Water immersion yes Using Q-tips: no Recurrent otitis media: no Status: worse Treatments attempted: none   Past Medical History:  Diagnosis Date   GERD (gastroesophageal reflux disease)    RARE-ROLAIDS PRN   Headache    RARE MIGRAINES   Hypertension     Past Surgical History:  Procedure Laterality Date   KNEE ARTHROSCOPY Left 11/27/2015   Procedure: left knee arthroscopy, medial menisectomy, excision of plica;  Surgeon: Hessie Knows, MD;  Location: [facility] ORS;  Service: Orthopedics;  Laterality: Left;   NO PAST SURGERIES     VASECTOMY      Family History  Problem Relation Age of Onset   Diabetes Mother    Hypertension Mother    Hypertension Father    Colon cancer Neg Hx    Prostate cancer Neg Hx     Social History   Socioeconomic History   Marital status: Married    Spouse name: Not on file   Number of children: Not on file   Years of education: Not on file   Highest education level: Not on file  Occupational History   Not on file  Tobacco Use   Smoking status: Never   Smokeless tobacco: Never  Vaping Use   Vaping Use: Never used  Substance and Sexual Activity   Alcohol use: Yes    Comment: OCC   Drug use: No   Sexual activity: Not on file  Other Topics Concern   Not on file  Social History Narrative   Not on file   Social Determinants of Health    Financial Resource Strain: Not on file  Food Insecurity: Not on file  Transportation Needs: Not on file  Physical Activity: Not on file  Stress: Not on file  Social Connections: Not on file  Intimate Partner Violence: Not on file    Outpatient Medications Prior to Visit  Medication Sig Dispense Refill   atorvastatin (LIPITOR) 10 MG tablet Take 1 tablet (10 mg total) by mouth daily. 90 tablet 1   glipiZIDE (GLUCOTROL XL) 5 MG 24 hr tablet Take 1 tablet (5 mg total) by mouth daily with breakfast. 90 tablet 1   lisinopril-hydrochlorothiazide (ZESTORETIC) 20-12.5 MG tablet Take 1 tablet by mouth daily. 90 tablet 1   sertraline (ZOLOFT) 100 MG tablet Take 1 tablet (100 mg total) by mouth daily. 90 tablet 1   metFORMIN (GLUCOPHAGE) 1000 MG tablet Take 1 tablet (1,000 mg total) by mouth 2 (two) times daily with a meal. 180 tablet 1   No facility-administered medications prior to visit.    No Known Allergies  Review of Systems  Constitutional: Negative.   HENT:  Positive for ear pain (right ear).   Eyes: Negative.   Respiratory: Negative.    Cardiovascular: Negative.  Gastrointestinal: Negative.   Skin: Negative.   Neurological: Negative.       Objective:    Physical Exam Vitals and nursing note reviewed.  Constitutional:      Appearance: Normal appearance.  HENT:     Head: Normocephalic.     Right Ear: External ear normal. Drainage present. A middle ear effusion is present. Tympanic membrane is erythematous.     Left Ear: Tympanic membrane, ear canal and external ear normal.  Eyes:     Conjunctiva/sclera: Conjunctivae normal.  Cardiovascular:     Rate and Rhythm: Normal rate and regular rhythm.     Pulses: Normal pulses.     Heart sounds: Normal heart sounds.  Pulmonary:     Effort: Pulmonary effort is normal.     Breath sounds: Normal breath sounds.  Musculoskeletal:     Cervical back: Normal range of motion.  Skin:    General: Skin is warm.  Neurological:      General: No focal deficit present.     Mental Status: He is alert and oriented to person, place, and time.  Psychiatric:        Mood and Affect: Mood normal.        Behavior: Behavior normal.        Thought Content: Thought content normal.        Judgment: Judgment normal.    BP 127/86   Pulse 91   Temp 98.6 F (37 C) (Oral)   Wt 285 lb 9.6 oz (129.5 kg)   SpO2 98%   BMI 39.83 kg/m  Wt Readings from Last 3 Encounters:  09/11/20 285 lb 9.6 oz (129.5 kg)  07/31/20 284 lb (128.8 kg)  04/29/20 284 lb (128.8 kg)    Health Maintenance Due  Topic Date Due   OPHTHALMOLOGY EXAM  Never done   COLONOSCOPY (Pts 45-70yr Insurance coverage will need to be confirmed)  Never done   INFLUENZA VACCINE  09/01/2020    There are no preventive care reminders to display for this patient.   Lab Results  Component Value Date   TSH 1.330 01/30/2020   Lab Results  Component Value Date   WBC 7.6 01/30/2020   HGB 17.3 01/30/2020   HCT 49.2 01/30/2020   MCV 89 01/30/2020   PLT 222 01/30/2020   Lab Results  Component Value Date   NA 136 07/31/2020   K 4.5 07/31/2020   CO2 21 07/31/2020   GLUCOSE 215 (H) 07/31/2020   BUN 10 07/31/2020   CREATININE 0.72 (L) 07/31/2020   BILITOT 0.4 07/31/2020   ALKPHOS 64 07/31/2020   AST 59 (H) 07/31/2020   ALT 92 (H) 07/31/2020   PROT 7.1 07/31/2020   ALBUMIN 5.0 07/31/2020   CALCIUM 9.8 07/31/2020   ANIONGAP 9 11/26/2015   EGFR 116 07/31/2020   Lab Results  Component Value Date   CHOL 160 07/31/2020   Lab Results  Component Value Date   HDL 47 07/31/2020   Lab Results  Component Value Date   LDLCALC 69 07/31/2020   Lab Results  Component Value Date   TRIG 274 (H) 07/31/2020   Lab Results  Component Value Date   CHOLHDL 3.4 07/31/2020   Lab Results  Component Value Date   HGBA1C 8.1 (H) 07/31/2020       Assessment & Plan:   Problem List Items Addressed This Visit   None Visit Diagnoses     Acute otitis media,  unspecified otitis media type    -  Primary   Right ear otitis media. Treat with augmentin BID x7 days. Can take tylenol or ibuprofen as needed for pain. F/U with PCP if symptoms don't improve.    Relevant Medications   amoxicillin-clavulanate (AUGMENTIN) 875-125 MG tablet        Meds ordered this encounter  Medications   amoxicillin-clavulanate (AUGMENTIN) 875-125 MG tablet    Sig: Take 1 tablet by mouth 2 (two) times daily.    Dispense:  14 tablet    Refill:  0      Charyl Dancer, NP

## 2020-09-29 ENCOUNTER — Emergency Department: Payer: 59

## 2020-09-29 ENCOUNTER — Emergency Department
Admission: EM | Admit: 2020-09-29 | Discharge: 2020-09-29 | Disposition: A | Discharge status: Home / Self Care | Payer: 59 | Attending: Emergency Medicine | Admitting: Emergency Medicine

## 2020-09-29 ENCOUNTER — Other Ambulatory Visit: Payer: Self-pay

## 2020-09-29 DIAGNOSIS — N12 Tubulo-interstitial nephritis, not specified as acute or chronic: Secondary | ICD-10-CM | POA: Diagnosis not present

## 2020-09-29 DIAGNOSIS — Z7984 Long term (current) use of oral hypoglycemic drugs: Secondary | ICD-10-CM | POA: Diagnosis not present

## 2020-09-29 DIAGNOSIS — R1031 Right lower quadrant pain: Secondary | ICD-10-CM | POA: Diagnosis present

## 2020-09-29 DIAGNOSIS — Z79899 Other long term (current) drug therapy: Secondary | ICD-10-CM | POA: Diagnosis not present

## 2020-09-29 DIAGNOSIS — E785 Hyperlipidemia, unspecified: Secondary | ICD-10-CM | POA: Diagnosis not present

## 2020-09-29 DIAGNOSIS — E1169 Type 2 diabetes mellitus with other specified complication: Secondary | ICD-10-CM | POA: Diagnosis not present

## 2020-09-29 LAB — URINALYSIS, COMPLETE (UACMP) WITH MICROSCOPIC
Bilirubin Urine: NEGATIVE
Glucose, UA: 150 mg/dL — AB
Ketones, ur: NEGATIVE mg/dL
Nitrite: NEGATIVE
Protein, ur: 100 mg/dL — AB
RBC / HPF: >50 RBC/hpf — ABNORMAL HIGH (ref 0–5)
Specific Gravity, Urine: 1.017 (ref 1.005–1.030)
WBC, UA: >50 WBC/hpf — ABNORMAL HIGH (ref 0–5)
pH: 5 (ref 5.0–8.0)

## 2020-09-29 LAB — COMPREHENSIVE METABOLIC PANEL
ALT: 76 U/L — ABNORMAL HIGH (ref 0–44)
AST: 53 U/L — ABNORMAL HIGH (ref 15–41)
Albumin: 4.1 g/dL (ref 3.5–5.0)
Alkaline Phosphatase: 58 U/L (ref 38–126)
Anion gap: 11 (ref 5–15)
BUN: 11 mg/dL (ref 6–20)
CO2: 26 mmol/L (ref 22–32)
Calcium: 9.2 mg/dL (ref 8.9–10.3)
Chloride: 97 mmol/L — ABNORMAL LOW (ref 98–111)
Creatinine, Ser: 0.61 mg/dL (ref 0.61–1.24)
GFR, Estimated: >60 mL/min (ref >60)
Glucose, Bld: 231 mg/dL — ABNORMAL HIGH (ref 70–99)
Potassium: 4.2 mmol/L (ref 3.5–5.1)
Sodium: 134 mmol/L — ABNORMAL LOW (ref 135–145)
Total Bilirubin: 0.8 mg/dL (ref 0.3–1.2)
Total Protein: 7.5 g/dL (ref 6.5–8.1)

## 2020-09-29 LAB — LIPASE, BLOOD: Lipase: 28 U/L (ref 11–51)

## 2020-09-29 LAB — CBC
HCT: 44.4 % (ref 39.0–52.0)
Hemoglobin: 15.9 g/dL (ref 13.0–17.0)
MCH: 31.7 pg (ref 26.0–34.0)
MCHC: 35.8 g/dL (ref 30.0–36.0)
MCV: 88.6 fL (ref 80.0–100.0)
Platelets: 237 10*3/uL (ref 150–400)
RBC: 5.01 MIL/uL (ref 4.22–5.81)
RDW: 11.9 % (ref 11.5–15.5)
WBC: 14.6 10*3/uL — ABNORMAL HIGH (ref 4.0–10.5)
nRBC: 0 % (ref 0.0–0.2)

## 2020-09-29 MED ORDER — LACTATED RINGERS IV BOLUS
1000.0000 mL | Freq: Once | INTRAVENOUS | Status: AC
  Administered 2020-09-29: 1000 mL via INTRAVENOUS

## 2020-09-29 MED ORDER — ONDANSETRON HCL 4 MG PO TABS: 4.0000 mg | ORAL_TABLET | Freq: Three times a day (TID) | ORAL | 0 refills | Status: AC | PRN

## 2020-09-29 MED ORDER — ONDANSETRON HCL 4 MG/2ML IJ SOLN
4.0000 mg | Freq: Once | INTRAMUSCULAR | Status: AC
  Administered 2020-09-29: 4 mg via INTRAVENOUS
  Filled 2020-09-29: qty 2

## 2020-09-29 MED ORDER — OXYCODONE HCL 5 MG PO TABS
5.0000 mg | ORAL_TABLET | ORAL | Status: AC
  Administered 2020-09-29: 5 mg via ORAL
  Filled 2020-09-29: qty 1

## 2020-09-29 MED ORDER — SODIUM CHLORIDE 0.9 % IV SOLN
1.0000 g | Freq: Once | INTRAVENOUS | Status: AC
  Administered 2020-09-29: 1 g via INTRAVENOUS
  Filled 2020-09-29: qty 10

## 2020-09-29 MED ORDER — IBUPROFEN 400 MG PO TABS
400.0000 mg | ORAL_TABLET | Freq: Once | ORAL | Status: AC
  Administered 2020-09-29: 400 mg via ORAL
  Filled 2020-09-29: qty 1

## 2020-09-29 MED ORDER — OXYCODONE-ACETAMINOPHEN 5-325 MG PO TABS: 1.0000 | ORAL_TABLET | ORAL | 0 refills | Status: AC | PRN

## 2020-09-29 MED ORDER — SULFAMETHOXAZOLE-TRIMETHOPRIM 800-160 MG PO TABS: 1.0000 | ORAL_TABLET | Freq: Two times a day (BID) | ORAL | 0 refills | Status: AC

## 2020-09-29 MED ORDER — MORPHINE SULFATE (PF) 4 MG/ML IV SOLN
6.0000 mg | Freq: Once | INTRAVENOUS | Status: AC
  Administered 2020-09-29: 6 mg via INTRAVENOUS
  Filled 2020-09-29: qty 2

## 2020-09-29 MED ORDER — ACETAMINOPHEN 500 MG PO TABS
1000.0000 mg | ORAL_TABLET | Freq: Once | ORAL | Status: AC
  Administered 2020-09-29: 1000 mg via ORAL
  Filled 2020-09-29: qty 2

## 2020-09-29 NOTE — ED Triage Notes (Signed)
Pt comes with c/o sever abdominal pan that started this morning. Pt states blood in urine as well.

## 2020-09-29 NOTE — ED Notes (Signed)
Pt asked if the rocephin is okay with having Amoxicillin w/in the last 2 weeks.  MD notified and aware and pt educated that the rocephin is fine to receive.

## 2020-09-29 NOTE — ED Notes (Signed)
Pt arrived back from CT

## 2020-09-29 NOTE — ED Provider Notes (Signed)
[facility] Emergency Department Provider Note  ____________________________________________   Event Date/Time   First MD Initiated Contact with Patient 09/29/20 1124     (approximate)  I have reviewed the triage vital signs and the nursing notes.   HISTORY  Chief Complaint Abdominal Pain   HPI Brent Compton is a 45 y.o. male with past medical history of GERD migraine headaches as well as strain, HDL, GAD and MDD who presents for assessment of subacute onset bilateral lower abdominal pain rating on both flanks to lower part of his abdomen associate with some gross hematuria that all started this morning.  No paroxysmal episodes or history of stones.  Patient also endorses some mild burning with urination.  He denies any upper abdominal pain, chest pain, cough, shortness of breath, headache, earache, sore throat, nausea, vomiting, diarrhea,, blood in his stool, rash or focal extremity weakness numbness or tingling.  He is not on any blood thinners and has not had any recent trauma.         Past Medical History:  Diagnosis Date   GERD (gastroesophageal reflux disease)    RARE-ROLAIDS PRN   Headache    RARE MIGRAINES   Hypertension     Patient Active Problem List   Diagnosis Date Noted   GAD (generalized anxiety disorder) 04/29/2020   MDD (major depressive disorder) 01/30/2020   Morbid obesity (HCC) 04/10/2019   Hyperlipidemia associated with type 2 diabetes mellitus (HCC) 03/16/2018   Hypertension associated with diabetes (HCC) 01/06/2018   Diabetes mellitus (HCC) 12/02/2017    Past Surgical History:  Procedure Laterality Date   KNEE ARTHROSCOPY Left 11/27/2015   Procedure: left knee arthroscopy, medial menisectomy, excision of plica;  Surgeon: Kennedy Bucker, MD;  Location: [facility] ORS;  Service: Orthopedics;  Laterality: Left;   NO PAST SURGERIES     VASECTOMY      Prior to Admission medications   Medication Sig Start Date End Date Taking?  Authorizing Provider  ondansetron (ZOFRAN) 4 MG tablet Take 1 tablet (4 mg total) by mouth every 8 (eight) hours as needed for up to 10 doses for nausea or vomiting. 09/29/20  Yes Gilles Chiquito, MD  sulfamethoxazole-trimethoprim (BACTRIM DS) 800-160 MG tablet Take 1 tablet by mouth 2 (two) times daily for 10 days. 09/29/20 10/09/20 Yes Gilles Chiquito, MD  amoxicillin-clavulanate (AUGMENTIN) 875-125 MG tablet Take 1 tablet by mouth 2 (two) times daily. 09/11/20   McElwee, Lauren A, NP  atorvastatin (LIPITOR) 10 MG tablet Take 1 tablet (10 mg total) by mouth daily. 04/29/20   Erasmo Downer, MD  glipiZIDE (GLUCOTROL XL) 5 MG 24 hr tablet Take 1 tablet (5 mg total) by mouth daily with breakfast. 04/29/20   Bacigalupo, Marzella Schlein, MD  lisinopril-hydrochlorothiazide (ZESTORETIC) 20-12.5 MG tablet Take 1 tablet by mouth daily. 04/29/20   Erasmo Downer, MD  metFORMIN (GLUCOPHAGE) 1000 MG tablet Take 1 tablet (1,000 mg total) by mouth 2 (two) times daily with a meal. 04/29/20 07/28/20  Bacigalupo, Marzella Schlein, MD  sertraline (ZOLOFT) 100 MG tablet Take 1 tablet (100 mg total) by mouth daily. 04/29/20 10/26/20  Erasmo Downer, MD    Allergies Patient has no known allergies.  Family History  Problem Relation Age of Onset   Diabetes Mother    Hypertension Mother    Hypertension Father    Colon cancer Neg Hx    Prostate cancer Neg Hx     Social History Social History   Tobacco Use  Smoking status: Never   Smokeless tobacco: Never  Vaping Use   Vaping Use: Never used  Substance Use Topics   Alcohol use: Yes    Comment: OCC   Drug use: No    Review of Systems  Review of Systems  Constitutional:  Negative for chills and fever.  HENT:  Negative for sore throat.   Eyes:  Negative for pain.  Respiratory:  Negative for cough and stridor.   Cardiovascular:  Negative for chest pain.  Gastrointestinal:  Positive for abdominal pain. Negative for vomiting.  Genitourinary:  Positive for  dysuria and hematuria.  Musculoskeletal:  Positive for back pain. Negative for myalgias.  Skin:  Negative for rash.  Neurological:  Negative for seizures, loss of consciousness and headaches.  Psychiatric/Behavioral:  Negative for suicidal ideas.   All other systems reviewed and are negative.    ____________________________________________   PHYSICAL EXAM:  VITAL SIGNS: ED Triage Vitals  Enc Vitals Group     BP 09/29/20 1116 (!) 186/101     Pulse Rate 09/29/20 1116 84     Resp 09/29/20 1116 20     Temp 09/29/20 1116 99 F (37.2 C)     Temp Source 09/29/20 1116 Oral     SpO2 09/29/20 1116 98 %     Weight 09/29/20 1116 280 lb (127 kg)     Height 09/29/20 1116 5\' 11"  (1.803 m)     Head Circumference --      Peak Flow --      Pain Score 09/29/20 1105 10     Pain Loc --      Pain Edu? --      Excl. in GC? --    Vitals:   09/29/20 1116 09/29/20 1250  BP: (!) 186/101 (!) 165/104  Pulse: 84 94  Resp: 20 20  Temp: 99 F (37.2 C)   SpO2: 98% 94%   Physical Exam Vitals and nursing note reviewed.  Constitutional:      Appearance: He is well-developed. He is ill-appearing.  HENT:     Head: Normocephalic and atraumatic.     Right Ear: External ear normal.     Left Ear: External ear normal.     Nose: Nose normal.  Eyes:     Conjunctiva/sclera: Conjunctivae normal.  Cardiovascular:     Rate and Rhythm: Normal rate and regular rhythm.     Heart sounds: No murmur heard. Pulmonary:     Effort: Pulmonary effort is normal. No respiratory distress.     Breath sounds: Normal breath sounds.  Abdominal:     Palpations: Abdomen is soft.     Tenderness: There is no abdominal tenderness. There is no right CVA tenderness or left CVA tenderness.  Musculoskeletal:     Cervical back: Neck supple.  Skin:    General: Skin is warm and dry.     Capillary Refill: Capillary refill takes less than 2 seconds.  Neurological:     Mental Status: He is alert and oriented to person, place, and  time.  Psychiatric:        Mood and Affect: Mood normal.     ____________________________________________   LABS (all labs ordered are listed, but only abnormal results are displayed)  Labs Reviewed  COMPREHENSIVE METABOLIC PANEL - Abnormal; Notable for the following components:      Result Value   Sodium 134 (*)    Chloride 97 (*)    Glucose, Bld 231 (*)    AST 53 (*)  ALT 76 (*)    All other components within normal limits  CBC - Abnormal; Notable for the following components:   WBC 14.6 (*)    All other components within normal limits  URINALYSIS, COMPLETE (UACMP) WITH MICROSCOPIC - Abnormal; Notable for the following components:   Color, Urine YELLOW (*)    APPearance CLOUDY (*)    Glucose, UA 150 (*)    Hgb urine dipstick LARGE (*)    Protein, ur 100 (*)    Leukocytes,Ua LARGE (*)    RBC / HPF >50 (*)    WBC, UA >50 (*)    Bacteria, UA MANY (*)    Non Squamous Epithelial PRESENT (*)    All other components within normal limits  URINE CULTURE  LIPASE, BLOOD   ____________________________________________  EKG  ____________________________________________  RADIOLOGY  ED MD interpretation: CT abdomen pelvis remarkable for some thickening of the bladder mild distention.  There is no evidence of significant perinephric stranding or kidney stone, diverticulitis, appendicitis, cholecystitis or other clear acute abdominopelvic pathology.  There is evidence of colonic diverticuli without diverticulitis.  Aortic atherosclerosis and some hepatomegaly with steatosis also noted.  No other acute abdominopelvic process.  Official radiology report(s): CT Renal Stone Study  Result Date: 09/29/2020 CLINICAL DATA:  Flank pain, renal stone suspected. EXAM: CT ABDOMEN AND PELVIS WITHOUT CONTRAST TECHNIQUE: Multidetector CT imaging of the abdomen and pelvis was performed following the standard protocol without IV contrast. COMPARISON:  CT November 23, 2012 FINDINGS: Lower chest: 3 mm  right lower lobe pulmonary nodule on image 2/4 stable dating back to November 23, 2012, consistent with benign etiology. No acute abnormality. Hepatobiliary: Hepatomegaly with diffuse hepatic steatosis with focal sparing along the gallbladder fossa. Gallbladder is unremarkable. No biliary ductal dilation. Pancreas: Within normal limits. Spleen: Within normal limits. Adrenals/Urinary Tract: Bilateral adrenal glands are unremarkable. No hydronephrosis. No nephrolithiasis. No obstructive ureteral or bladder calculus visualized. Mild wall symmetric thickening of an incompletely distended urinary bladder with some subtle perivesicular stranding. Stomach/Bowel: Stomach is unremarkable. No pathologic dilation of small or large bowel. The appendix and terminal ileum appear normal. Colonic diverticulosis without findings of acute diverticulitis. Vascular/Lymphatic: Aortic atherosclerosis without aneurysmal dilation. No pathologically enlarged abdominal or pelvic lymph nodes. Reproductive: Prostate is unremarkable. Other: No abdominopelvic ascites. Musculoskeletal: Multilevel degenerative changes spine. No acute osseous abnormality. IMPRESSION: 1. Mild wall symmetric thickening of an incompletely distended urinary bladder with some subtle perivesicular stranding, possibly reflecting cystitis recommend correlation with urinalysis. 2. No nephrolithiasis or evidence of obstructive ureteral or bladder calculus. 3. Hepatomegaly with diffuse hepatic steatosis. 4. Colonic diverticulosis without findings of acute diverticulitis. 5.  Aortic Atherosclerosis (ICD10-I70.0). Electronically Signed   By: Maudry Mayhew M.D.   On: 09/29/2020 12:57    ____________________________________________   PROCEDURES  Procedure(s) performed (including Critical Care):  Procedures   ____________________________________________   INITIAL IMPRESSION / ASSESSMENT AND PLAN / ED COURSE      Patient presents with above-stated history exam  for assessment of some bilateral and middle lower abdominal pain rating around both flanks to the lower back.  This is associated with some acute onset of gross hematuria and mild burning.  On arrival he is hypertensive with a BP of 186/101 was otherwise stable vital signs on room air.  Differential includes kidney stone, pyelonephritis, nephritis, diverticulitis and cystitis.  CT abdomen pelvis remarkable for some thickening of the bladder mild distention.  There is no evidence of significant perinephric stranding or kidney stone, diverticulitis, appendicitis, cholecystitis or  other clear acute abdominopelvic pathology.  There is evidence of colonic diverticuli without diverticulitis.  Aortic atherosclerosis and some hepatomegaly with steatosis also noted.  No other acute abdominopelvic process.  CMP remarkable for some mild hyperglycemia with a glucose of 231 and mild transaminitis without any other significant electrolyte or metabolic derangements.  No evidence of acidosis or elevated anion gap to suggest DKA.  No evidence of cholestasis.  Patient not consistent with acute pancreatitis.  CBC with leukocytosis with WBC count of 14.6 without acute anemia or thrombo-cytopenia.  UA with large hemoglobin, 100 protein, large leukocyte esterase and greater than 50 RBCs as well as 50 WBCs with many bacteria noted.  Urine culture sent.  Overall presentation and work-up is most consistent with acute cystitis however I do not believe patient is septic.  On reassessment he is feeling much better and his nausea much improved.  Will write Rx for Bactrim as well as Zofran.  Recommend close outpatient PCP follow-up.  Discharged stable condition.  Strict return precautions advised and discussed.     ____________________________________________   FINAL CLINICAL IMPRESSION(S) / ED DIAGNOSES  Final diagnoses:  Pyelonephritis    Medications  cefTRIAXone (ROCEPHIN) 1 g in sodium chloride 0.9 % 100 mL IVPB (has no  administration in time range)  acetaminophen (TYLENOL) tablet 1,000 mg (has no administration in time range)  ibuprofen (ADVIL) tablet 400 mg (has no administration in time range)  lactated ringers bolus 1,000 mL (1,000 mLs Intravenous New Bag/Given 09/29/20 1215)  ondansetron (ZOFRAN) injection 4 mg (4 mg Intravenous Given 09/29/20 1151)  morphine 4 MG/ML injection 6 mg (6 mg Intravenous Given 09/29/20 1153)     ED Discharge Orders          Ordered    sulfamethoxazole-trimethoprim (BACTRIM DS) 800-160 MG tablet  2 times daily        09/29/20 1327    ondansetron (ZOFRAN) 4 MG tablet  Every 8 hours PRN        09/29/20 1327             Note:  This document was prepared using Dragon voice recognition software and may include unintentional dictation errors.    Gilles Chiquito, MD 09/29/20 1330

## 2020-10-01 LAB — URINE CULTURE: Culture: >=100000 — AB

## 2020-10-27 ENCOUNTER — Other Ambulatory Visit: Payer: Self-pay | Admitting: Family Medicine

## 2020-10-27 DIAGNOSIS — E1169 Type 2 diabetes mellitus with other specified complication: Secondary | ICD-10-CM

## 2020-10-27 DIAGNOSIS — I10 Essential (primary) hypertension: Secondary | ICD-10-CM

## 2020-10-27 NOTE — Telephone Encounter (Signed)
Requested medication (s) are due for refill today: yes  Requested medication (s) are on the active medication list: yes  Last refill:  both meds refilled on 04/29/20 #90 1 RF  Future visit scheduled: yes  Notes to clinic:  overdue   Requested Prescriptions  Pending Prescriptions Disp Refills   lisinopril-hydrochlorothiazide (ZESTORETIC) 20-12.5 MG tablet [Pharmacy Med Name: LISINOPRIL-HCTZ 20-12.5 MG TAB] 30 tablet 5    Sig: TAKE 1 TABLET BY MOUTH EVERY DAY     Cardiovascular:  ACEI + Diuretic Combos Failed - 10/27/2020  8:33 AM      Failed - Na in normal range and within 180 days    Sodium  Date Value Ref Range Status  09/29/2020 134 (L) 135 - 145 mmol/L Final  07/31/2020 136 134 - 144 mmol/L Final          Failed - Last BP in normal range    BP Readings from Last 1 Encounters:  09/29/20 (!) 168/97          Passed - K in normal range and within 180 days    Potassium  Date Value Ref Range Status  09/29/2020 4.2 3.5 - 5.1 mmol/L Final          Passed - Cr in normal range and within 180 days    Creatinine, Ser  Date Value Ref Range Status  09/29/2020 0.61 0.61 - 1.24 mg/dL Final          Passed - Ca in normal range and within 180 days    Calcium  Date Value Ref Range Status  09/29/2020 9.2 8.9 - 10.3 mg/dL Final          Passed - Patient is not pregnant      Passed - Valid encounter within last 6 months    Recent Outpatient Visits           1 month ago Acute otitis media, unspecified otitis media type   [facility] McElwee, Lauren A, NP   2 months ago Type 2 diabetes mellitus with other specified complication, without long-term current use of insulin (HCC)   [facility] [place], Marzella Schlein, MD   6 months ago Type 2 diabetes mellitus with other specified complication, without long-term current use of insulin (HCC)   [facility] [place], Marzella Schlein, MD   9 months ago Annual physical exam   [facility], Adriana M, PA-C   1 year ago Type 2 diabetes mellitus with other specified complication, without long-term current use of insulin (HCC)   [facility] [place], [place], [place]       Future Appointments             In 4 days Bacigalupo, Marzella Schlein, MD [facility]             glipiZIDE (GLUCOTROL XL) 5 MG 24 hr tablet [Pharmacy Med Name: GLIPIZIDE ER 5 MG TABLET] 30 tablet 5    Sig: TAKE 1 TABLET BY MOUTH EVERY DAY WITH BREAKFAST     Endocrinology:  Diabetes - Sulfonylureas Failed - 10/27/2020  8:33 AM      Failed - HBA1C is between 0 and 7.9 and within 180 days    Hgb A1c MFr Bld  Date Value Ref Range Status  07/31/2020 8.1 (H) 4.8 - 5.6 % Final    Comment:             Prediabetes: 5.7 - 6.4  Diabetes: >6.4          Glycemic control for adults with diabetes: <7.0           Passed - Valid encounter within last 6 months    Recent Outpatient Visits           1 month ago Acute otitis media, unspecified otitis media type   [facility] McElwee, Lauren A, NP   2 months ago Type 2 diabetes mellitus with other specified complication, without long-term current use of insulin (HCC)   [facility] [place], Marzella Schlein, MD   6 months ago Type 2 diabetes mellitus with other specified complication, without long-term current use of insulin [facility])   [facility] [place], Marzella Schlein, MD   9 months ago Annual physical exam   [facility] [place] M, PA-C   1 year ago Type 2 diabetes mellitus with other specified complication, without long-term current use of insulin [facility])   [facility] [place], [place], [place]       Future Appointments             In 4 days Bacigalupo, Marzella Schlein, MD [facility], [facility]

## 2020-10-31 ENCOUNTER — Ambulatory Visit (INDEPENDENT_AMBULATORY_CARE_PROVIDER_SITE_OTHER): Payer: 59 | Admitting: Family Medicine

## 2020-10-31 ENCOUNTER — Encounter: Payer: Self-pay | Admitting: Family Medicine

## 2020-10-31 ENCOUNTER — Other Ambulatory Visit: Payer: Self-pay

## 2020-10-31 VITALS — BP 157/93 | HR 71 | Temp 99.0°F | Resp 16 | Wt 289.0 lb

## 2020-10-31 DIAGNOSIS — E1169 Type 2 diabetes mellitus with other specified complication: Secondary | ICD-10-CM | POA: Diagnosis not present

## 2020-10-31 DIAGNOSIS — F331 Major depressive disorder, recurrent, moderate: Secondary | ICD-10-CM | POA: Diagnosis not present

## 2020-10-31 DIAGNOSIS — E1159 Type 2 diabetes mellitus with other circulatory complications: Secondary | ICD-10-CM

## 2020-10-31 DIAGNOSIS — I152 Hypertension secondary to endocrine disorders: Secondary | ICD-10-CM

## 2020-10-31 DIAGNOSIS — F411 Generalized anxiety disorder: Secondary | ICD-10-CM

## 2020-10-31 DIAGNOSIS — E785 Hyperlipidemia, unspecified: Secondary | ICD-10-CM

## 2020-10-31 DIAGNOSIS — I10 Essential (primary) hypertension: Secondary | ICD-10-CM

## 2020-10-31 LAB — POCT GLYCOSYLATED HEMOGLOBIN (HGB A1C): Hemoglobin A1C: 8.2 % — AB (ref 4.0–5.6)

## 2020-10-31 MED ORDER — OZEMPIC (0.25 OR 0.5 MG/DOSE) 2 MG/1.5ML ~~LOC~~ SOPN: PEN_INJECTOR | SUBCUTANEOUS | 0 refills | Status: AC

## 2020-10-31 MED ORDER — LISINOPRIL-HYDROCHLOROTHIAZIDE 20-12.5 MG PO TABS: 2.0000 | ORAL_TABLET | Freq: Every day | ORAL | 1 refills | Status: AC

## 2020-10-31 NOTE — Progress Notes (Signed)
Established patient visit   Patient: Brent Compton   DOB: May 16, 1975   45 y.o. Male  MRN: [id number] Visit Date: 10/31/2020  Today's healthcare provider: Shirlee Latch, MD   Chief Complaint  Patient presents with   Diabetes   Hypertension   Hyperlipidemia   Subjective    Diabetes Pertinent negatives for hypoglycemia include no dizziness or headaches. Pertinent negatives for diabetes include no chest pain, no fatigue and no weakness.  Hypertension Pertinent negatives include no chest pain, headaches, neck pain, palpitations or shortness of breath.  Hyperlipidemia Pertinent negatives include no chest pain, myalgias or shortness of breath.    Diabetes Mellitus Type II, follow-up  Lab Results  Component Value Date   HGBA1C 8.2 (A) 10/31/2020   HGBA1C 8.1 (H) 07/31/2020   HGBA1C 9.8 (A) 04/29/2020   Last seen for diabetes 3 months ago.  Management since then includes continuing the same treatment.  He is tolerating glipizide 5 mg and metformin 1000 mg. He is amenable to starting ozempic and he will follow-up to determine insurance coverage.   He reports good compliance with treatment. He is not having side effects. none  Home blood sugar records: fasting range: not checking  Episodes of hypoglycemia? No none   Current insulin regiment: none Most Recent Eye Exam: due  --------------------------------------------------------------------------------------------------- Hypertension, follow-up  BP Readings from Last 3 Encounters:  10/31/20 (!) 157/93  09/29/20 (!) 168/97  09/11/20 127/86   Wt Readings from Last 3 Encounters:  10/31/20 289 lb (131.1 kg)  09/29/20 280 lb (127 kg)  09/11/20 285 lb 9.6 oz (129.5 kg)     He was last seen for hypertension 3 months ago.  BP at that visit was 131/89.  He is taking lisinopril HCTZ 20 mg and is amenable to taking a higher dosage.  Management since that visit includes no changes. He reports good compliance with  treatment. He is not having side effects. none He is not exercising. He is not adherent to low salt diet.   Outside blood pressures are not checking.  He does not smoke.  Use of agents associated with hypertension: none.   --------------------------------------------------------------------------------------------------- Lipid/Cholesterol, follow-up  Last Lipid Panel: Lab Results  Component Value Date   CHOL 160 07/31/2020   LDLCALC 69 07/31/2020   LDLDIRECT 76 11/29/2017   HDL 47 07/31/2020   TRIG 274 (H) 07/31/2020    He was last seen for this 3 months ago.  Management since that visit includes no changes.  He reports good compliance with treatment. He is not having side effects. {document side effects if present:1  He is following a Regular diet. Current exercise: none  Last metabolic panel Lab Results  Component Value Date   GLUCOSE 231 (H) 09/29/2020   NA 134 (L) 09/29/2020   K 4.2 09/29/2020   BUN 11 09/29/2020   CREATININE 0.61 09/29/2020   GFRNONAA >60 09/29/2020   GFRAA 129 01/30/2020   CALCIUM 9.2 09/29/2020   AST 53 (H) 09/29/2020   ALT 76 (H) 09/29/2020   The 10-year ASCVD risk score (Arnett DK, et al., 2019) is: 4.8%  Mood He is tolerating zoloft 100 mg and mood/ anxiety are in a good place.   Impacted cerumen of right ear He went to the beach and he returned with an ear infection in his right ear. He was provided some antibiotics however he reports sound is muffled.  Recommend debrox drops  He decline the flu vaccine at this time.  ---------------------------------------------------------------------------------------------------  Medications: Outpatient Medications Prior to Visit  Medication Sig   atorvastatin (LIPITOR) 10 MG tablet Take 1 tablet (10 mg total) by mouth daily.   glipiZIDE (GLUCOTROL XL) 5 MG 24 hr tablet TAKE 1 TABLET BY MOUTH EVERY DAY WITH BREAKFAST   ondansetron (ZOFRAN) 4 MG tablet Take 1 tablet (4 mg total) by  mouth every 8 (eight) hours as needed for up to 10 doses for nausea or vomiting.   [DISCONTINUED] lisinopril-hydrochlorothiazide (ZESTORETIC) 20-12.5 MG tablet TAKE 1 TABLET BY MOUTH EVERY DAY   metFORMIN (GLUCOPHAGE) 1000 MG tablet Take 1 tablet (1,000 mg total) by mouth 2 (two) times daily with a meal.   sertraline (ZOLOFT) 100 MG tablet Take 1 tablet (100 mg total) by mouth daily.   [DISCONTINUED] amoxicillin-clavulanate (AUGMENTIN) 875-125 MG tablet Take 1 tablet by mouth 2 (two) times daily. (Patient not taking: Reported on 10/31/2020)   No facility-administered medications prior to visit.    Review of Systems  Constitutional:  Negative for chills, fatigue and fever.  HENT:  Negative for ear pain, sinus pressure, sinus pain and sore throat.   Eyes:  Negative for pain and visual disturbance.  Respiratory:  Negative for cough, chest tightness, shortness of breath and wheezing.   Cardiovascular:  Negative for chest pain, palpitations and leg swelling.  Gastrointestinal:  Negative for abdominal pain, diarrhea, nausea and vomiting.  Genitourinary:  Negative for flank pain, frequency and urgency.  Musculoskeletal:  Negative for back pain, myalgias and neck pain.  Neurological:  Negative for dizziness, weakness, light-headedness, numbness and headaches.       Objective    BP (!) 157/93 (BP Location: Right Arm, Patient Position: Sitting, Cuff Size: Large)   Pulse 71   Temp 99 F (37.2 C) (Oral)   Resp 16   Wt 289 lb (131.1 kg)   SpO2 98%   BMI 40.31 kg/m  BP Readings from Last 3 Encounters:  10/31/20 (!) 157/93  09/29/20 (!) 168/97  09/11/20 127/86   Wt Readings from Last 3 Encounters:  10/31/20 289 lb (131.1 kg)  09/29/20 280 lb (127 kg)  09/11/20 285 lb 9.6 oz (129.5 kg)      Physical Exam Vitals reviewed.  Constitutional:      General: He is not in acute distress.    Appearance: Normal appearance. He is not diaphoretic.  HENT:     Head: Normocephalic and atraumatic.      Right Ear: There is impacted cerumen.     Left Ear: There is no impacted cerumen.  Eyes:     General: No scleral icterus.    Conjunctiva/sclera: Conjunctivae normal.  Cardiovascular:     Rate and Rhythm: Normal rate and regular rhythm.     Pulses: Normal pulses.     Heart sounds: Normal heart sounds. No murmur heard. Pulmonary:     Effort: Pulmonary effort is normal. No respiratory distress.     Breath sounds: Normal breath sounds. No wheezing or rhonchi.  Abdominal:     General: There is no distension.     Palpations: Abdomen is soft.     Tenderness: There is no abdominal tenderness.  Musculoskeletal:     Cervical back: Neck supple.     Right lower leg: No edema.     Left lower leg: No edema.  Lymphadenopathy:     Cervical: No cervical adenopathy.  Skin:    General: Skin is warm and dry.     Capillary Refill: Capillary refill takes less than 2 seconds.  Findings: No rash.  Neurological:     Mental Status: He is alert and oriented to person, place, and time.     Cranial Nerves: No cranial nerve deficit.  Psychiatric:        Mood and Affect: Mood normal.        Behavior: Behavior normal.      Results for orders placed or performed in visit on 10/31/20  POCT glycosylated hemoglobin (Hb A1C)  Result Value Ref Range   Hemoglobin A1C 8.2 (A) 4.0 - 5.6 %   Est. average glucose Bld gHb Est-mCnc      Assessment & Plan     Problem List Items Addressed This Visit       Cardiovascular and Mediastinum   Hypertension associated with diabetes (HCC)    Chronic and uncontrolled Double lisinopril-hctz dose to 40-25 mg daily Reviewed recent metabolic panel F/u in 26m      Relevant Medications   lisinopril-hydrochlorothiazide (ZESTORETIC) 20-12.5 MG tablet   Semaglutide,0.25 or 0.5MG /DOS, (OZEMPIC, 0.25 OR 0.5 MG/DOSE,) 2 MG/1.5ML SOPN     Endocrine   Diabetes mellitus (HCC) - Primary    Chronic and uncontrolled with hyperglycemia Associated with HTN and  HLD Continue current meds Add Ozempic Hope to d/c glipizide in the future F/u in 32m and repeat A1c      Relevant Medications   lisinopril-hydrochlorothiazide (ZESTORETIC) 20-12.5 MG tablet   Semaglutide,0.25 or 0.5MG /DOS, (OZEMPIC, 0.25 OR 0.5 MG/DOSE,) 2 MG/1.5ML SOPN   Other Relevant Orders   POCT glycosylated hemoglobin (Hb A1C) (Completed)   Hyperlipidemia associated with type 2 diabetes mellitus (HCC)    Previously well controlled Continue statin Repeat FLP and CMP at next visit Goal LDL < 70      Relevant Medications   lisinopril-hydrochlorothiazide (ZESTORETIC) 20-12.5 MG tablet   Semaglutide,0.25 or 0.5MG /DOS, (OZEMPIC, 0.25 OR 0.5 MG/DOSE,) 2 MG/1.5ML SOPN     Other   Morbid obesity (HCC)    Discussed importance of healthy weight management Discussed diet and exercise       Relevant Medications   Semaglutide,0.25 or 0.5MG /DOS, (OZEMPIC, 0.25 OR 0.5 MG/DOSE,) 2 MG/1.5ML SOPN   MDD (major depressive disorder)    Chronic and well controlled Continue current meds       GAD (generalized anxiety disorder)    Chronic and well controlled Continue current meds      Other Visit Diagnoses     Hypertension, unspecified type       Relevant Medications   lisinopril-hydrochlorothiazide (ZESTORETIC) 20-12.5 MG tablet        Return in about 3 months (around 01/30/2021) for chronic disease f/u.      I,Essence Turner,acting as a Neurosurgeon for Shirlee Latch, MD.,have documented all relevant documentation on the behalf of Shirlee Latch, MD,as directed by  Shirlee Latch, MD while in the presence of Shirlee Latch, MD.   I, Shirlee Latch, MD, have reviewed all documentation for this visit. The documentation on 10/31/20 for the exam, diagnosis, procedures, and orders are all accurate and complete.    Wenzlick, Marzella Schlein, MD, MPH [facility]

## 2020-10-31 NOTE — Assessment & Plan Note (Signed)
Chronic and uncontrolled with hyperglycemia Associated with HTN and HLD Continue current meds Add Ozempic Hope to d/c glipizide in the future F/u in 14m and repeat A1c

## 2020-10-31 NOTE — Assessment & Plan Note (Signed)
Chronic and well controlled °Continue current meds °

## 2020-10-31 NOTE — Assessment & Plan Note (Signed)
Discussed importance of healthy weight management Discussed diet and exercise  

## 2020-10-31 NOTE — Assessment & Plan Note (Signed)
Previously well controlled Continue statin Repeat FLP and CMP at next visit Goal LDL < 70  

## 2020-10-31 NOTE — Assessment & Plan Note (Signed)
Chronic and uncontrolled Double lisinopril-hctz dose to 40-25 mg daily Reviewed recent metabolic panel F/u in 24m

## 2020-10-31 NOTE — Patient Instructions (Addendum)
Ozempic savings card  Debrox drops for ear wax (R ear)

## 2020-11-14 ENCOUNTER — Telehealth: Payer: Self-pay

## 2020-11-14 ENCOUNTER — Other Ambulatory Visit: Payer: Self-pay | Admitting: Family Medicine

## 2020-11-14 DIAGNOSIS — T753XXA Motion sickness, initial encounter: Secondary | ICD-10-CM

## 2020-11-14 MED ORDER — SCOPOLAMINE 1 MG/3DAYS TD PT72: 1.0000 | MEDICATED_PATCH | TRANSDERMAL | 12 refills | Status: AC

## 2020-11-14 NOTE — Telephone Encounter (Signed)
Please review Dr. Beryle Flock is out of office. KW

## 2020-11-14 NOTE — Telephone Encounter (Signed)
Copied from CRM [id number]. Topic: Quick Communication - Rx Refill/Question >> Nov 14, 2020  2:13 PM Pawlus, Maxine Glenn A wrote: Pt stated he is going on a fishing trip and wanted to know if Dr B would be willing to send in Motion sickness patches, please advise. [id number] - [place], [place] - [address]

## 2020-11-14 NOTE — Telephone Encounter (Signed)
Patient advised.

## 2020-12-17 ENCOUNTER — Telehealth: Payer: Self-pay

## 2020-12-17 DIAGNOSIS — E1169 Type 2 diabetes mellitus with other specified complication: Secondary | ICD-10-CM

## 2020-12-17 NOTE — Telephone Encounter (Signed)
Copied from CRM [id number]. Topic: General - Other >> Dec 17, 2020  9:51 AM Jaquita Rector A wrote: Reason for CRM: Patient called in to inform Dr B that he is almost out of metFORMIN (GLUCOPHAGE) 1000 MG tablet and needed to know if she will continue him on this medication or try the Ozempic (semaglutide) - injection 0.5mg  that was discussed back at last office visit would like a call back with an update ASAP please Can be reached at Ph# [phone]

## 2020-12-18 MED ORDER — METFORMIN HCL 1000 MG PO TABS: 1000.0000 mg | ORAL_TABLET | Freq: Two times a day (BID) | ORAL | 1 refills | Status: AC

## 2020-12-18 NOTE — Telephone Encounter (Signed)
We discussed adding Ozempic to Metformin. He should take both.  Refill Metformin please

## 2020-12-18 NOTE — Telephone Encounter (Signed)
[facility] reports [facility] and [facility] report [facility] was not received gave a verbal.

## 2020-12-18 NOTE — Telephone Encounter (Signed)
Patient advised to sign up for savings card for Ozempic.

## 2021-02-27 NOTE — Progress Notes (Deleted)
Established patient visit   Patient: Brent Compton   DOB: March 08, 1975   46 y.o. Male  MRN: [id number] Visit Date: 03/02/2021  Today's healthcare provider: Shirlee Latch, MD   No chief complaint on file.  Subjective    HPI  Diabetes Mellitus Type II, Follow-up  Lab Results  Component Value Date   HGBA1C 8.2 (A) 10/31/2020   HGBA1C 8.1 (H) 07/31/2020   HGBA1C 9.8 (A) 04/29/2020   Wt Readings from Last 3 Encounters:  10/31/20 289 lb (131.1 kg)  09/29/20 280 lb (127 kg)  09/11/20 285 lb 9.6 oz (129.5 kg)   Last seen for diabetes 3 months ago.  Management since then includes Continue current meds Add Ozempic Hope to d/c glipizide in the future. He reports {excellent/good/fair/poor:19665} compliance with treatment. He {is/is not:21021397} having side effects. {document side effects if present:1} Symptoms: {Yes/No:20286} fatigue {Yes/No:20286} foot ulcerations  {Yes/No:20286} appetite changes {Yes/No:20286} nausea  {Yes/No:20286} paresthesia of the feet  {Yes/No:20286} polydipsia  {Yes/No:20286} polyuria {Yes/No:20286} visual disturbances   {Yes/No:20286} vomiting     Home blood sugar records: {diabetes glucometry results:16657}  Episodes of hypoglycemia? {Yes/No:20286} {enter symptoms and frequency of symptoms if yes:1}  Most Recent Eye Exam: *** {Current exercise:16438:::1} {Current diet habits:16563:::1} ---------------------------------------------------------------------------------------------------  Hypertension, follow-up  BP Readings from Last 3 Encounters:  10/31/20 (!) 157/93  09/29/20 (!) 168/97  09/11/20 127/86   Wt Readings from Last 3 Encounters:  10/31/20 289 lb (131.1 kg)  09/29/20 280 lb (127 kg)  09/11/20 285 lb 9.6 oz (129.5 kg)     He was last seen for hypertension 3 months ago.  BP at that visit was 157/93. Management since that visit includes Double lisinopril-hctz dose to 40-25 mg daily.  He reports  {excellent/good/fair/poor:19665} compliance with treatment. He {is/is not:9024} having side effects. {document side effects if present:1} He is following a {diet:21022986} diet. He {is/is not:9024} exercising. He {does/does not:200015} smoke.  Outside blood pressures are {***enter patient reported home BP readings, or 'not being checked':1}. Symptoms: {Yes/No:20286} chest pain {Yes/No:20286} chest pressure  {Yes/No:20286} palpitations {Yes/No:20286} syncope  {Yes/No:20286} dyspnea {Yes/No:20286} orthopnea  {Yes/No:20286} paroxysmal nocturnal dyspnea {Yes/No:20286} lower extremity edema   ---------------------------------------------------------------------------------------------------  Lipid/Cholesterol, Follow-up  Last lipid panel Other pertinent labs  Lab Results  Component Value Date   CHOL 160 07/31/2020   HDL 47 07/31/2020   LDLCALC 69 07/31/2020   LDLDIRECT 76 11/29/2017   TRIG 274 (H) 07/31/2020   CHOLHDL 3.4 07/31/2020   Lab Results  Component Value Date   ALT 76 (H) 09/29/2020   AST 53 (H) 09/29/2020   PLT 237 09/29/2020   TSH 1.330 01/30/2020     He was last seen for this 3 months ago.  Management since that visit includes Continue statin Repeat FLP and CMP at next visit.  He reports {excellent/good/fair/poor:19665} compliance with treatment. He {is/is not:9024} having side effects. {document side effects if present:1}  Symptoms: {Yes/No:20286} chest pain {Yes/No:20286} chest pressure/discomfort  {Yes/No:20286} dyspnea {Yes/No:20286} lower extremity edema  {Yes/No:20286} numbness or tingling of extremity {Yes/No:20286} orthopnea  {Yes/No:20286} palpitations {Yes/No:20286} paroxysmal nocturnal dyspnea  {Yes/No:20286} speech difficulty {Yes/No:20286} syncope   Current diet: {diet habits:16563} Current exercise: {exercise types:16438}  The 10-year ASCVD risk score (Arnett DK, et al., 2019) is:  4.8%  ---------------------------------------------------------------------------------------------------   Medications: Outpatient Medications Prior to Visit  Medication Sig   atorvastatin (LIPITOR) 10 MG tablet Take 1 tablet (10 mg total) by mouth daily.   glipiZIDE (GLUCOTROL XL) 5 MG 24 hr tablet TAKE  1 TABLET BY MOUTH EVERY DAY WITH BREAKFAST   lisinopril-hydrochlorothiazide (ZESTORETIC) 20-12.5 MG tablet Take 2 tablets by mouth daily.   metFORMIN (GLUCOPHAGE) 1000 MG tablet Take 1 tablet (1,000 mg total) by mouth 2 (two) times daily with a meal.   ondansetron (ZOFRAN) 4 MG tablet Take 1 tablet (4 mg total) by mouth every 8 (eight) hours as needed for up to 10 doses for nausea or vomiting.   scopolamine (TRANSDERM-SCOP) 1 MG/3DAYS Place 1 patch (1.5 mg total) onto the skin every 3 (three) days.   sertraline (ZOLOFT) 100 MG tablet Take 1 tablet (100 mg total) by mouth daily.   No facility-administered medications prior to visit.    Review of Systems  {Labs   Heme   Chem   Endocrine   Serology   Results Review (optional):23779}   Objective    There were no vitals taken for this visit. {Show previous vital signs (optional):23777}  Physical Exam  ***  No results found for any visits on 03/02/21.  Assessment & Plan     ***  No follow-ups on file.      {provider attestation***:1}   Shirlee Latch, MD  [facility] [phone] (phone) [phone] (fax)  [facility]

## 2021-03-02 ENCOUNTER — Ambulatory Visit: Payer: 59 | Admitting: Family Medicine

## 2023-08-10 IMAGING — CT CT RENAL STONE PROTOCOL
2 of 3 series · 16 of 46 positions shown, 18 images · non-contrast
Comparison: CT November 23, 2012

CLINICAL DATA: Flank pain, renal stone suspected.

EXAM:
CT ABDOMEN AND PELVIS WITHOUT CONTRAST
TECHNIQUE: Multidetector CT imaging of the abdomen and pelvis was performed
following the standard protocol without IV contrast.

[Series 2: stone full standard · axial · 0.94mm/px · z∈[-1048,-578]mm · 13 of 110 slices shown, 15 images]
[im 8/110  soft-tissue]
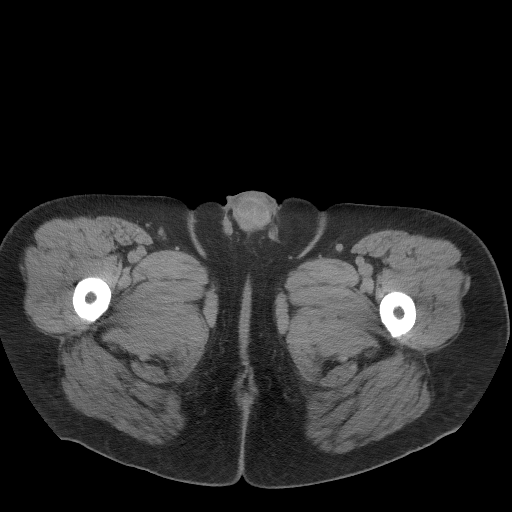
[im 8/110  bone]
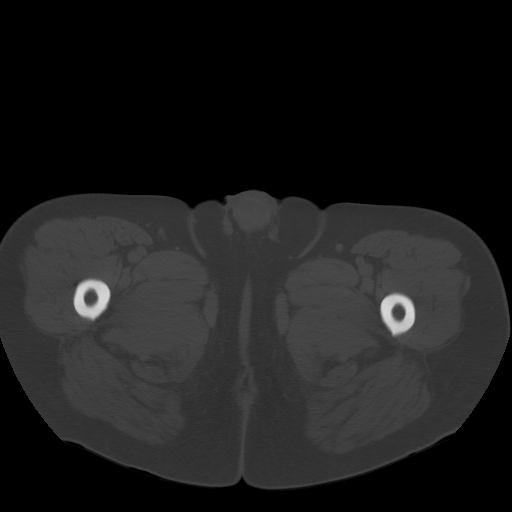
[im 15/110  soft-tissue]
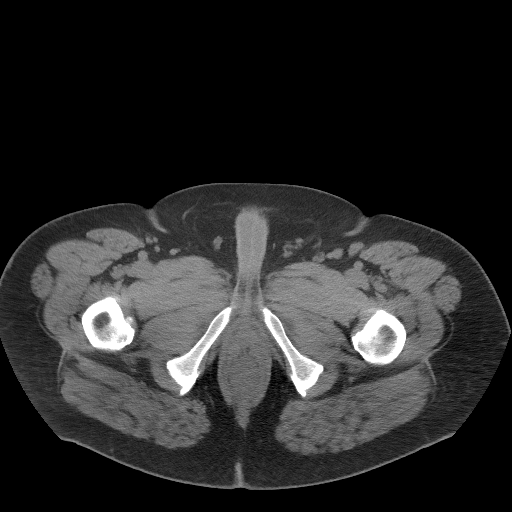
[im 22/110  soft-tissue]
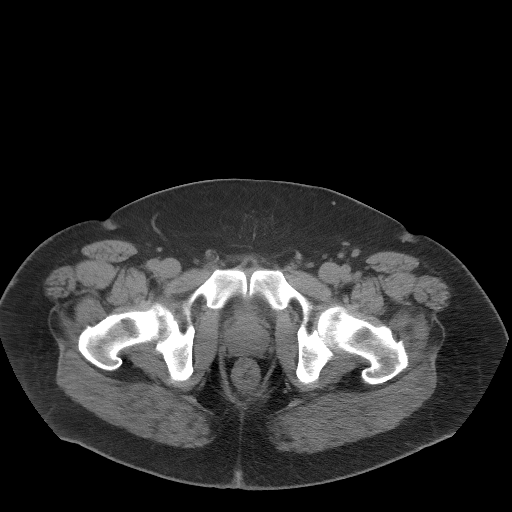
[im 32/110  soft-tissue]
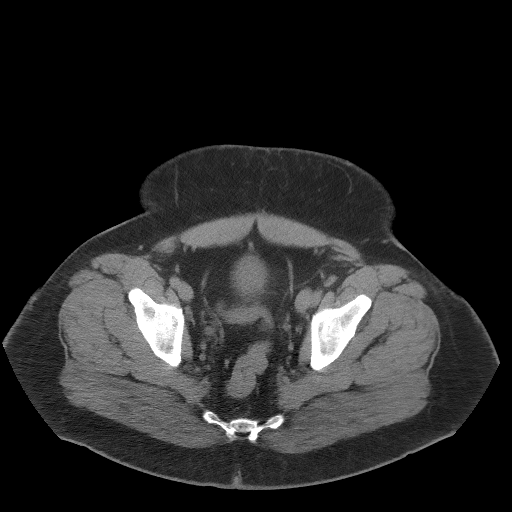
[im 39/110  soft-tissue]
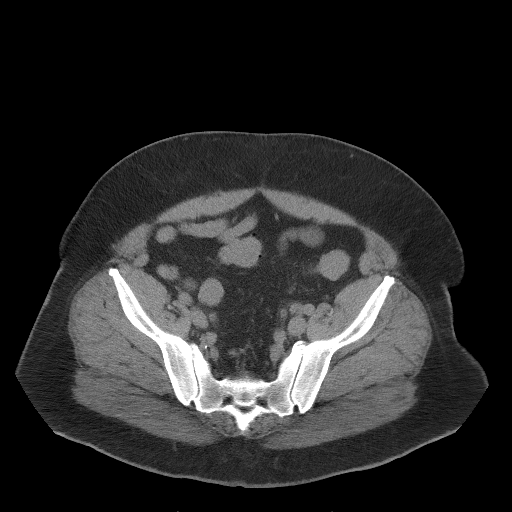
[im 46/110  soft-tissue]
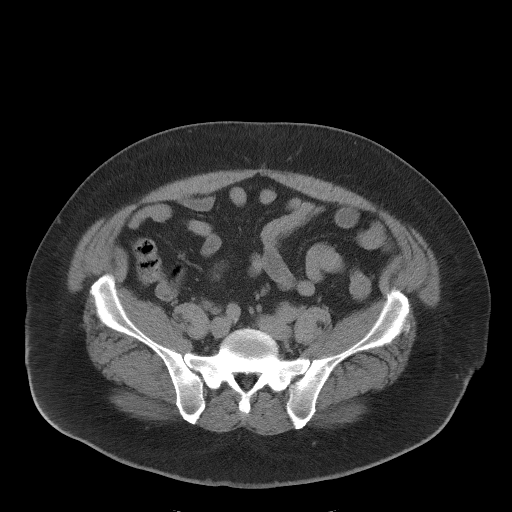
[im 57/110  soft-tissue]
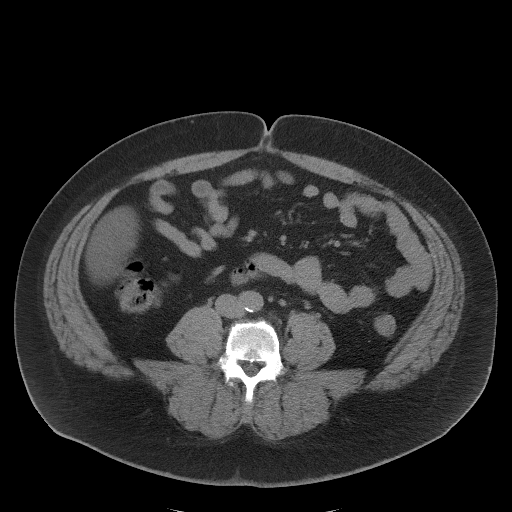
[im 64/110  soft-tissue]
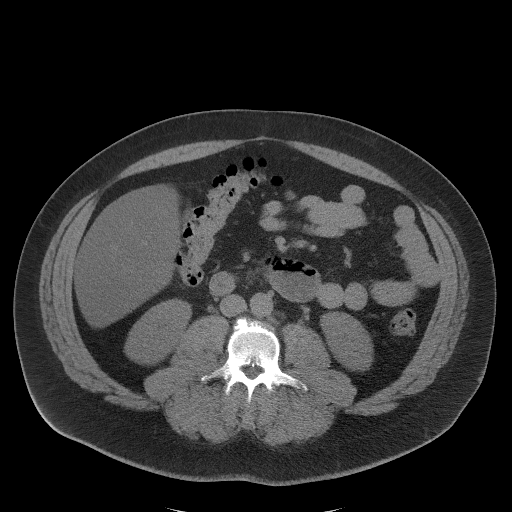
[im 71/110  soft-tissue]
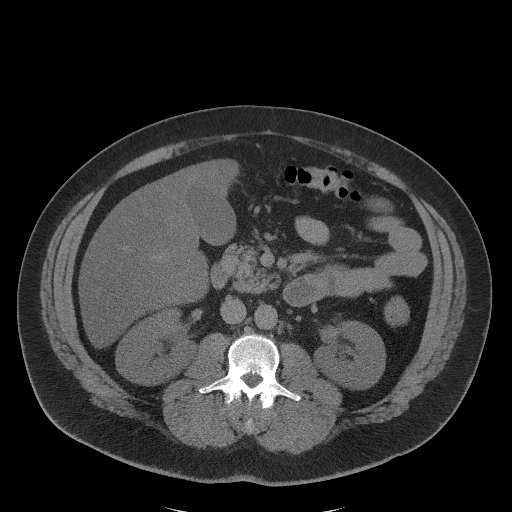
[im 71/110  bone]
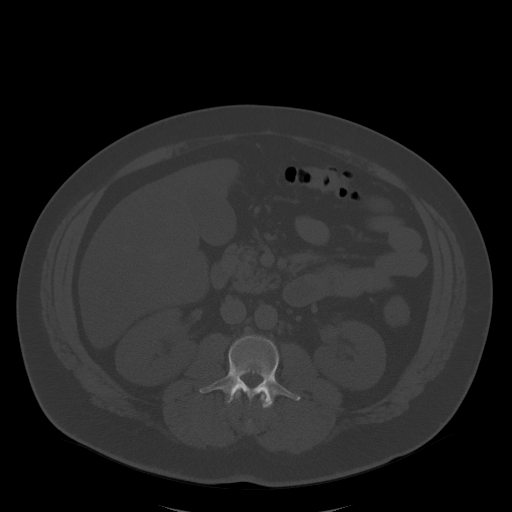
[im 78/110  soft-tissue]
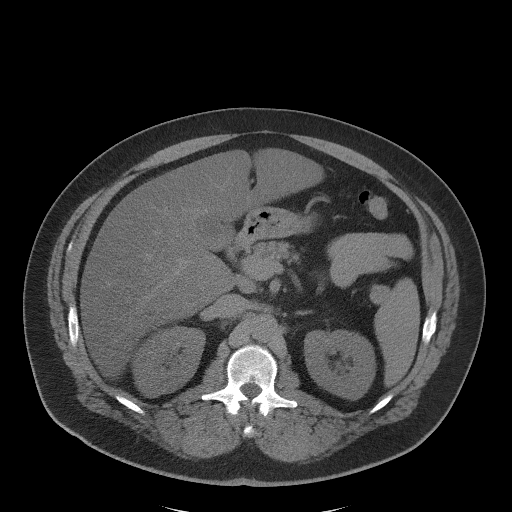
[im 88/110  soft-tissue]
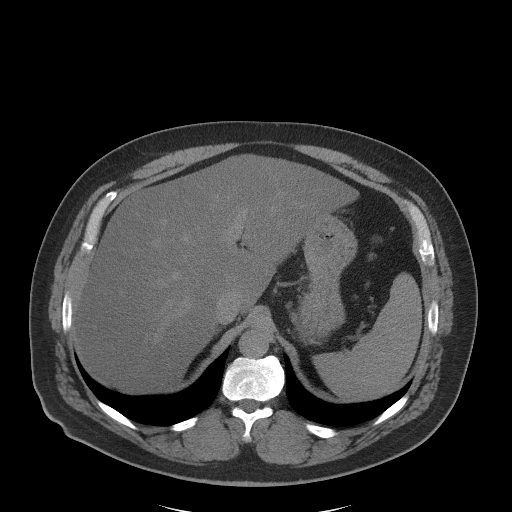
[im 95/110  soft-tissue]
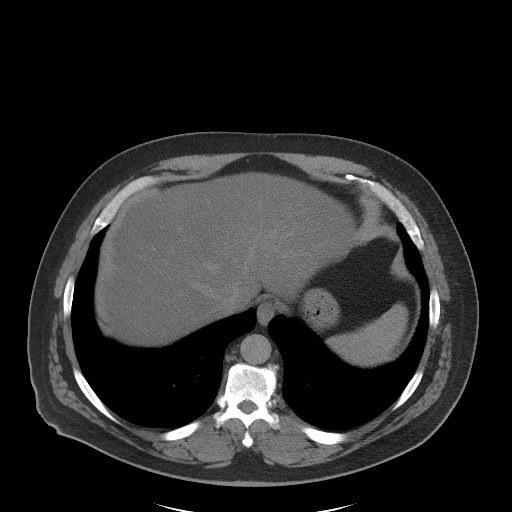
[im 102/110  soft-tissue]
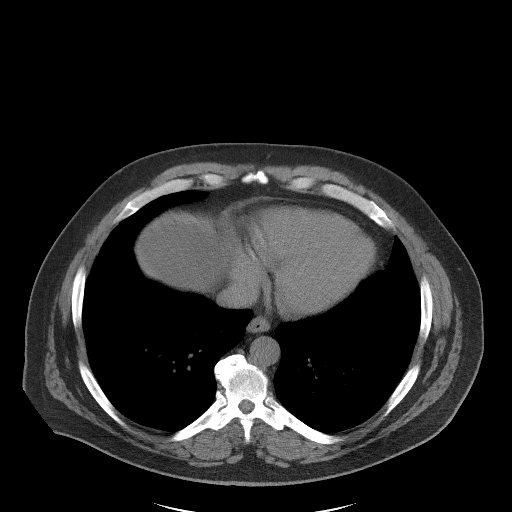

[Series 5: coronal · coronal · 0.95mm/px · 3 of 181 slices shown]
[im 61/181  soft-tissue]
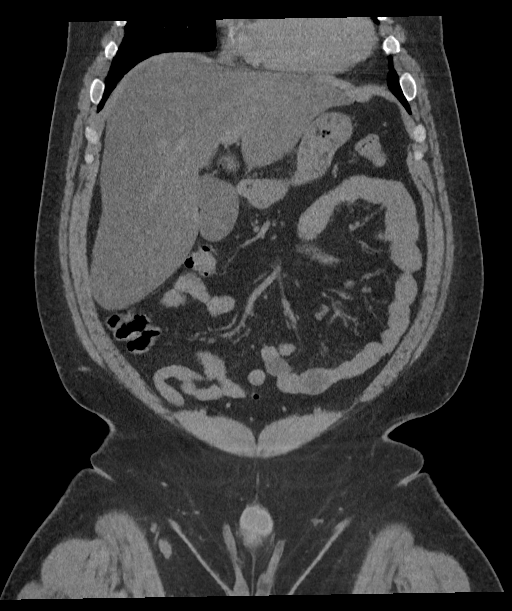
[im 81/181  soft-tissue]
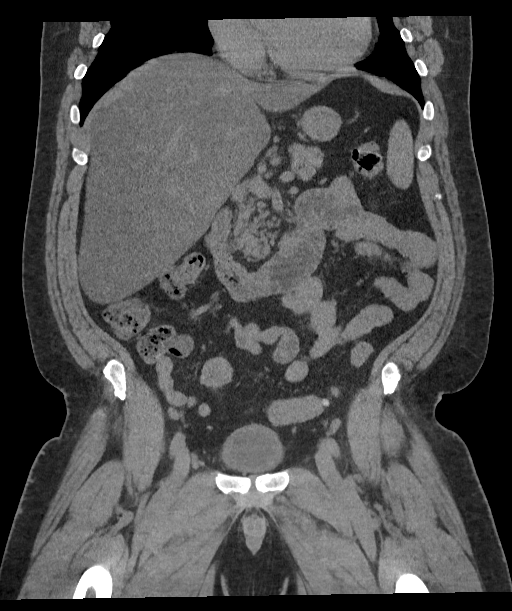
[im 101/181  soft-tissue]
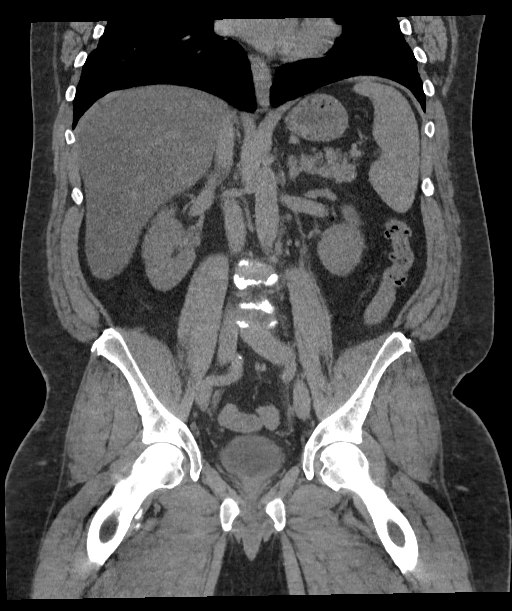

[16 of 46 positions shown; findings below may reference images not displayed]

FINDINGS: Lower chest: 3 mm right lower lobe pulmonary nodule on image [DATE]
stable dating back to November 23, 2012, consistent with benign
etiology. No acute abnormality.

Hepatobiliary: Hepatomegaly with diffuse hepatic steatosis with
focal sparing along the gallbladder fossa. Gallbladder is
unremarkable. No biliary ductal dilation.

Pancreas: Within normal limits.

Spleen: Within normal limits.

Adrenals/Urinary Tract: Bilateral adrenal glands are unremarkable.

No hydronephrosis. No nephrolithiasis. No obstructive ureteral or
bladder calculus visualized.

Mild wall symmetric thickening of an incompletely distended urinary
bladder with some subtle perivesicular stranding.

Stomach/Bowel: Stomach is unremarkable. No pathologic dilation of
small or large bowel. The appendix and terminal ileum appear normal.
Colonic diverticulosis without findings of acute diverticulitis.

Vascular/Lymphatic: Aortic atherosclerosis without aneurysmal
dilation. No pathologically enlarged abdominal or pelvic lymph
nodes.

Reproductive: Prostate is unremarkable.

Other: No abdominopelvic ascites.

Musculoskeletal: Multilevel degenerative changes spine. No acute
osseous abnormality.
IMPRESSION: 1. Mild wall symmetric thickening of an incompletely distended
urinary bladder with some subtle perivesicular stranding, possibly
reflecting cystitis recommend correlation with urinalysis.
2. No nephrolithiasis or evidence of obstructive ureteral or bladder
calculus.
3. Hepatomegaly with diffuse hepatic steatosis.
4. Colonic diverticulosis without findings of acute diverticulitis.
5.  Aortic Atherosclerosis ([id number]).
